# Patient Record
Sex: Female | Born: 1965 | Race: White | Hispanic: No | Marital: Married | Smoking: Never smoker
Health system: Southern US, Community
[De-identification: ages and names within clinical notes are randomized; demographics above are authoritative.]

## PROBLEM LIST (undated history)

## (undated) DIAGNOSIS — E785 Hyperlipidemia, unspecified: Secondary | ICD-10-CM

## (undated) DIAGNOSIS — M5030 Other cervical disc degeneration, unspecified cervical region: Secondary | ICD-10-CM

## (undated) DIAGNOSIS — N200 Calculus of kidney: Secondary | ICD-10-CM

## (undated) DIAGNOSIS — I10 Essential (primary) hypertension: Secondary | ICD-10-CM

## (undated) DIAGNOSIS — Z9889 Other specified postprocedural states: Secondary | ICD-10-CM

## (undated) DIAGNOSIS — E0590 Thyrotoxicosis, unspecified without thyrotoxic crisis or storm: Secondary | ICD-10-CM

## (undated) DIAGNOSIS — E119 Type 2 diabetes mellitus without complications: Secondary | ICD-10-CM

## (undated) DIAGNOSIS — D649 Anemia, unspecified: Secondary | ICD-10-CM

## (undated) DIAGNOSIS — T7840XA Allergy, unspecified, initial encounter: Secondary | ICD-10-CM

## (undated) DIAGNOSIS — K219 Gastro-esophageal reflux disease without esophagitis: Secondary | ICD-10-CM

## (undated) DIAGNOSIS — M859 Disorder of bone density and structure, unspecified: Secondary | ICD-10-CM

## (undated) DIAGNOSIS — Z87442 Personal history of urinary calculi: Secondary | ICD-10-CM

## (undated) HISTORY — DX: Thyrotoxicosis, unspecified without thyrotoxic crisis or storm: E05.90

## (undated) HISTORY — DX: Type 2 diabetes mellitus without complications: E11.9

## (undated) HISTORY — PX: URETHRAL DILATION: SUR417

## (undated) HISTORY — PX: COLONOSCOPY: SHX174

## (undated) HISTORY — DX: Essential (primary) hypertension: I10

## (undated) HISTORY — DX: Anemia, unspecified: D64.9

## (undated) HISTORY — PX: LITHOTRIPSY: SUR834

## (undated) HISTORY — PX: CHOLECYSTECTOMY: SHX55

## (undated) HISTORY — DX: Hyperlipidemia, unspecified: E78.5

## (undated) HISTORY — PX: KIDNEY STONE SURGERY: SHX686

## (undated) HISTORY — PX: DILATION AND CURETTAGE OF UTERUS: SHX78

## (undated) HISTORY — DX: Allergy, unspecified, initial encounter: T78.40XA

---

## 1996-03-14 HISTORY — PX: BACK SURGERY: SHX140

## 1998-01-05 ENCOUNTER — Ambulatory Visit (HOSPITAL_COMMUNITY): Admission: RE | Admit: 1998-01-05 | Discharge: 1998-01-05 | Payer: Self-pay | Admitting: Urology

## 1998-01-05 ENCOUNTER — Encounter: Payer: Self-pay | Admitting: Urology

## 1998-04-22 ENCOUNTER — Observation Stay (HOSPITAL_COMMUNITY): Admission: RE | Admit: 1998-04-22 | Discharge: 1998-04-23 | Payer: Self-pay | Admitting: Specialist

## 1998-04-22 ENCOUNTER — Encounter: Payer: Self-pay | Admitting: Specialist

## 1999-02-06 ENCOUNTER — Encounter: Payer: Self-pay | Admitting: Emergency Medicine

## 1999-02-06 ENCOUNTER — Emergency Department (HOSPITAL_COMMUNITY): Admission: EM | Admit: 1999-02-06 | Discharge: 1999-02-06 | Payer: Self-pay | Admitting: Emergency Medicine

## 1999-02-19 ENCOUNTER — Encounter: Admission: RE | Admit: 1999-02-19 | Discharge: 1999-02-19 | Payer: Self-pay | Admitting: Urology

## 1999-02-19 ENCOUNTER — Encounter: Payer: Self-pay | Admitting: Urology

## 1999-02-22 ENCOUNTER — Ambulatory Visit (HOSPITAL_COMMUNITY): Admission: RE | Admit: 1999-02-22 | Discharge: 1999-02-22 | Payer: Self-pay | Admitting: Urology

## 1999-02-22 ENCOUNTER — Encounter: Payer: Self-pay | Admitting: Urology

## 1999-03-01 ENCOUNTER — Ambulatory Visit (HOSPITAL_COMMUNITY): Admission: RE | Admit: 1999-03-01 | Discharge: 1999-03-01 | Payer: Self-pay | Admitting: Urology

## 1999-03-01 ENCOUNTER — Encounter: Payer: Self-pay | Admitting: Urology

## 1999-03-12 ENCOUNTER — Encounter: Admission: RE | Admit: 1999-03-12 | Discharge: 1999-03-12 | Payer: Self-pay | Admitting: Urology

## 1999-03-12 ENCOUNTER — Encounter: Payer: Self-pay | Admitting: Urology

## 1999-06-18 ENCOUNTER — Encounter: Payer: Self-pay | Admitting: Urology

## 1999-06-18 ENCOUNTER — Encounter: Admission: RE | Admit: 1999-06-18 | Discharge: 1999-06-18 | Payer: Self-pay | Admitting: Urology

## 2000-07-26 ENCOUNTER — Encounter: Payer: Self-pay | Admitting: Urology

## 2000-07-26 ENCOUNTER — Ambulatory Visit (HOSPITAL_COMMUNITY): Admission: RE | Admit: 2000-07-26 | Discharge: 2000-07-26 | Payer: Self-pay | Admitting: Urology

## 2002-08-13 ENCOUNTER — Encounter: Payer: Self-pay | Admitting: Emergency Medicine

## 2002-08-13 ENCOUNTER — Emergency Department (HOSPITAL_COMMUNITY): Admission: EM | Admit: 2002-08-13 | Discharge: 2002-08-13 | Payer: Self-pay | Admitting: Emergency Medicine

## 2002-09-05 ENCOUNTER — Ambulatory Visit (HOSPITAL_BASED_OUTPATIENT_CLINIC_OR_DEPARTMENT_OTHER): Admission: RE | Admit: 2002-09-05 | Discharge: 2002-09-05 | Payer: Self-pay | Admitting: Urology

## 2005-01-11 ENCOUNTER — Ambulatory Visit: Payer: Self-pay | Admitting: Gastroenterology

## 2005-01-13 ENCOUNTER — Ambulatory Visit: Payer: Self-pay | Admitting: Gastroenterology

## 2005-02-01 ENCOUNTER — Ambulatory Visit: Payer: Self-pay | Admitting: Gastroenterology

## 2005-03-04 ENCOUNTER — Ambulatory Visit (HOSPITAL_COMMUNITY): Admission: RE | Admit: 2005-03-04 | Discharge: 2005-03-04 | Payer: Self-pay | Admitting: General Surgery

## 2005-03-04 ENCOUNTER — Encounter (INDEPENDENT_AMBULATORY_CARE_PROVIDER_SITE_OTHER): Payer: Self-pay | Admitting: Specialist

## 2005-07-18 ENCOUNTER — Ambulatory Visit: Payer: Self-pay | Admitting: Internal Medicine

## 2005-07-27 ENCOUNTER — Ambulatory Visit: Payer: Self-pay | Admitting: Internal Medicine

## 2005-07-27 ENCOUNTER — Encounter (INDEPENDENT_AMBULATORY_CARE_PROVIDER_SITE_OTHER): Payer: Self-pay | Admitting: *Deleted

## 2006-07-21 ENCOUNTER — Other Ambulatory Visit: Admission: RE | Admit: 2006-07-21 | Discharge: 2006-07-21 | Payer: Self-pay | Admitting: Family Medicine

## 2006-07-24 ENCOUNTER — Encounter: Admission: RE | Admit: 2006-07-24 | Discharge: 2006-07-24 | Payer: Self-pay | Admitting: Family Medicine

## 2007-07-23 ENCOUNTER — Other Ambulatory Visit: Admission: RE | Admit: 2007-07-23 | Discharge: 2007-07-23 | Payer: Self-pay | Admitting: Family Medicine

## 2007-07-27 ENCOUNTER — Encounter: Admission: RE | Admit: 2007-07-27 | Discharge: 2007-07-27 | Payer: Self-pay | Admitting: Family Medicine

## 2007-08-20 ENCOUNTER — Ambulatory Visit (HOSPITAL_COMMUNITY): Admission: RE | Admit: 2007-08-20 | Discharge: 2007-08-20 | Payer: Self-pay | Admitting: Obstetrics and Gynecology

## 2007-08-20 ENCOUNTER — Encounter (INDEPENDENT_AMBULATORY_CARE_PROVIDER_SITE_OTHER): Payer: Self-pay | Admitting: Obstetrics and Gynecology

## 2007-09-10 ENCOUNTER — Inpatient Hospital Stay (HOSPITAL_COMMUNITY): Admission: RE | Admit: 2007-09-10 | Discharge: 2007-09-13 | Payer: Self-pay | Admitting: Urology

## 2009-02-17 ENCOUNTER — Encounter: Payer: Self-pay | Admitting: Nurse Practitioner

## 2009-03-17 ENCOUNTER — Telehealth: Payer: Self-pay | Admitting: Internal Medicine

## 2009-03-18 ENCOUNTER — Ambulatory Visit: Payer: Self-pay | Admitting: Gastroenterology

## 2009-03-18 DIAGNOSIS — E119 Type 2 diabetes mellitus without complications: Secondary | ICD-10-CM

## 2009-03-18 DIAGNOSIS — I10 Essential (primary) hypertension: Secondary | ICD-10-CM | POA: Insufficient documentation

## 2009-03-18 DIAGNOSIS — R935 Abnormal findings on diagnostic imaging of other abdominal regions, including retroperitoneum: Secondary | ICD-10-CM

## 2009-03-18 DIAGNOSIS — K648 Other hemorrhoids: Secondary | ICD-10-CM | POA: Insufficient documentation

## 2009-03-18 DIAGNOSIS — L719 Rosacea, unspecified: Secondary | ICD-10-CM | POA: Insufficient documentation

## 2009-03-18 DIAGNOSIS — K644 Residual hemorrhoidal skin tags: Secondary | ICD-10-CM | POA: Insufficient documentation

## 2009-03-20 ENCOUNTER — Ambulatory Visit (HOSPITAL_COMMUNITY): Admission: RE | Admit: 2009-03-20 | Discharge: 2009-03-20 | Payer: Self-pay | Admitting: Gastroenterology

## 2009-03-23 ENCOUNTER — Telehealth: Payer: Self-pay | Admitting: Nurse Practitioner

## 2009-03-27 ENCOUNTER — Telehealth: Payer: Self-pay | Admitting: Nurse Practitioner

## 2010-04-13 NOTE — Procedures (Signed)
Summary: [facility] COLON   Colonoscopy  Procedure date:  07/27/2005  Findings:      Location:  [facility].    Procedures Next Due Date:    Colonoscopy: 08/2015 Patient Name: Kelli Brandt, Kelli Brandt. MRN:  Procedure Procedures: Colonoscopy CPT: [phone].    with polypectomy. CPT: A3573898.  Personnel: Endoscopist: Wilhemina Bonito. Marina Goodell, MD.  Referred By: Bernadette Hoit, MD.  Exam Location: Exam performed in Outpatient Clinic. Outpatient  Patient Consent: Procedure, Alternatives, Risks and Benefits discussed, consent obtained, from patient. Consent was obtained by the RN.  Indications  Evaluation of: Anemia with low iron saturation. Microcytic.  Symptoms: Hematochezia.  History  Current Medications: Patient is not currently taking Coumadin.  Pre-Exam Physical: Performed Jul 27, 2005. Cardio-pulmonary exam, Rectal exam, Abdominal exam, Mental status exam WNL.  Comments: Pt. history reviewed/updated, physical exam performed prior to initiation of sedation?YES Exam Exam: Extent of exam reached: Terminal Ileum, extent intended: Terminal Ileum.  The cecum was identified by appendiceal orifice and IC valve. Patient position: on left side. Colon retroflexion performed. Images taken. ASA Classification: II. Tolerance: excellent.  Monitoring: Pulse and BP monitoring, Oximetry used. Supplemental O2 given.  Colon Prep Used OSMO PREP for colon prep. Prep results: excellent.  Sedation Meds: Patient assessed and found to be appropriate for moderate (conscious) sedation. Fentanyl 75 mcg. given IV. Versed 9 mg. given IV.  Findings NORMAL EXAM: Ileum to Rectum.  POLYP: Cecum, diminutive, Procedure:  snare without cautery, removed, not retrieved, ICD9: Colon Polyps: 211.3. Comments: No meaningful tissue avaiable for submission.   Assessment  Diagnoses: 211.3: Colon Polyps.  455.0: Hemorrhoids, Internal.   Events  Unplanned Interventions: No intervention was required.  Unplanned  Events: There were no complications. Plans Disposition: After procedure patient sent to recovery. After recovery patient sent home.  Scheduling/Referral: Colonoscopy, to Wilhemina Bonito. Marina Goodell, MD, in 10 years,    cc:  Bernadette Hoit, MD  This report was created from the original endoscopy report, which was reviewed and signed by the above listed endoscopist.

## 2010-04-13 NOTE — Assessment & Plan Note (Signed)
Summary: severe hemorrhoid pain   History of Present Illness Visit Type: new patient Primary GI MD: Yancey Flemings MD Primary Provider: Joycelyn Rua, MD Chief Complaint: hemorrhoid pain x 7 days, pt denies any rectal bleeding History of Present Illness:   Patient evaluated for iron deficiency anemia in 2005 by  Dr. Marina Goodell. She is now worked in for hemorrhoids and rectal pain. Has hemorrhoidal flares from time to time but usually responds to sitz baths and Prep H. She is having associated rectal pain. No bleeding.  Initially denied constipation but then recalls she occasionally has a hard stool. Patient has no other gastrointestinal complaints.    GI Review of Systems      Denies abdominal pain, acid reflux, belching, bloating, chest pain, dysphagia with liquids, dysphagia with solids, heartburn, loss of appetite, nausea, vomiting, vomiting blood, weight loss, and  weight gain.      Reports hemorrhoids and  rectal pain.     Denies anal fissure, black tarry stools, change in bowel habit, constipation, diarrhea, diverticulosis, fecal incontinence, heme positive stool, irritable bowel syndrome, jaundice, light color stool, liver problems, and  rectal bleeding.   Current Medications (verified): 1)  Lisinopril-Hydrochlorothiazide 20-25 Mg Tabs (Lisinopril-Hydrochlorothiazide) .... Take 1 Tablet By Mouth Once A Day 2)  Doxycycline Hyclate 100 Mg Tabs (Doxycycline Hyclate) .... Take 1 Tablet By Mouth Once A Day 3)  Metformin Hcl 500 Mg Tabs (Metformin Hcl) .... Take 1 Tablet By Mouth Two Times A Day 4)  Omeprazole 20 Mg Cpdr (Omeprazole) .... Take 2 Tablets By Mouth Once Daily 5)  Urocit-K 5 5 Meq (540 Mg) Cr-Tabs (Potassium Citrate) .... Take 1 Tablet By Mouth Two Times A Day 6)  Taclonex 0.005-0.064 % Oint (Calcipotriene-Betameth Diprop) .... Apply As Needed For Psoriasis 7)  Clobex Spray 0.05 % Liqd (Clobetasol Propionate) .... Apply As Needed For Psoriasis  Allergies (verified): No Known Drug  Allergies  Past History:  Past Medical History: colon polyp (diminutive), not retrieved-2005 Diabetes Hypertension Kidney Stones Urinary Tract Infection Rosacea Psoriasis  Past Surgical History: Back Surgery Cholecystectomy (gallstones) Lithotripsy X 2 D&C/Novasure 6/09 Surgical removal of kidney stones  Family History: Family History of Breast Cancer: MGM? No FH of Colon Cancer: Family History of Colon Polyps: Father Family History of Diabetes: Father, Mother Family History of Heart Disease: Father  Social History: Married, 1 boy, 1 girl Patient has never smoked.  Alcohol Use - no Daily Caffeine Use 1/day Illicit Drug Use - no  Review of Systems       The patient complains of allergy/sinus.  The patient denies anemia, anxiety-new, arthritis/joint pain, back pain, blood in urine, breast changes/lumps, confusion, cough, coughing up blood, depression-new, fainting, fatigue, fever, headaches-new, hearing problems, heart murmur, heart rhythm changes, itching, menstrual pain, muscle pains/cramps, night sweats, nosebleeds, pregnancy symptoms, shortness of breath, skin rash, sleeping problems, sore throat, swelling of feet/legs, swollen lymph glands, thirst - excessive, urination - excessive, urination changes/pain, urine leakage, vision changes, and voice change.    Vital Signs:  Patient profile:   45 year old female Height:      64.5 inches Weight:      190 pounds BMI:     32.23 Pulse rate:   68 / minute Pulse rhythm:   regular BP sitting:   96 / 62  (left arm) Cuff size:   regular  Vitals Entered By: Francee Piccolo CMA Duncan Dull) (March 18, 2009 10:48 AM)  Physical Exam  General:  Well developed, well nourished, no acute distress.  Head:  Normocephalic and atraumatic. Eyes:  Conjunctiva pink, no icterus.  Neck:  no obvious masses  Lungs:  Clear throughout to auscultation. Heart:  Regular rate and rhythm; no murmurs, rubs,  or bruits. Abdomen:  Abdomen soft,  nontender, nondistended. No obvious masses. Liver edge 5 fingerbredths below right subcostal margin..Normal bowel sounds.  Rectal:  Very large inflamed hemorrhoid, non-thrombosed, reducible. Digital exam not done secdondary to discomfort. Msk:  Symmetrical with no gross deformities. Normal posture. Extremities:  No palmar erythema, no edema.  Neurologic:  Alert and  oriented x4;  grossly normal neurologically. Skin:  Psoriactic lesions on abdomen, elbows. Cervical Nodes:  No significant cervical adenopathy. Psych:  Alert and cooperative. Normal mood and affect.   Impression & Recommendations:  Problem # 1:  HEMORRHOIDS-INTERNAL (ICD-455.0) Assessment Deteriorated Grade 3 internal hemorrhoid. Use Miralax for next few days to keep stools soft. Start Anusol Suppositories, fiber supplements, continue sitz baths. Recheck in one week, if no improvement will send for surgical evaluation.  Problem # 2:  NONSPEC ABN FINDNG RAD & OTH EXAM ABDOMINAL AREA (ICD-793.6) Assessment: New Hepatomegaly on exam. Obtain recent labs from PCP, patient recalls some LFT abnormalities. She is nervous about possibility of liver disease. We discussed likely causes such as medications, fatty liver disease. Obtain U/S of abdomen. Will call patient with results and any further recommendations based on those results.  Problem # 3:  DIABETES MELLITUS-TYPE II (ICD-250.00) Assessment: Comment Only On Metformin.   Problem # 4:  HYPERTENSION (ICD-401.9) Assessment: Comment Only  Problem # 5:  ROSACEA (ICD-695.3) On chronic Doxycycline.  Patient Instructions: 1)  We schedueld the [facility] at [facility] for 03-20-09 at 8:00AM.Instructions provided. 2)  We sent persciption for Xylocaine Jelly and Anusol Hc Suppositories to [facility]. 3)  Continue Sitz Baths. 4)  Use Miralax, 17 grams in 8 oz water for 3-4 days to avoid constipation. 5)  High Fiber, Low Fat  Healthy Eating Plan brochure given.  6)   Use Benefiber as a fiber supplement daily.  You can get that at your pharmacy. 7)  Call us next week, if you still feel the hemorrhoids we will be glad to do a quick recheck and have you come in.  If things are better you don't have to come in. 8)  Copy sent to : Joycelyn Rua, MD 9)  The medication list was reviewed and reconciled.  All changed / newly prescribed medications were explained.  A complete medication list was provided to the patient / caregiver.  Prescriptions: ANUSOL-HC 25 MG SUPP (HYDROCORTISONE ACETATE) Use 1 supp twice daily x 10 days  #10 x 1   Entered by:   Lowry Ram NCMA   Authorized by:   Willette Cluster NP   Signed by:   Lowry Ram NCMA on 03/18/2009   Method used:   Electronically to        [facility] [phone]* (retail)       [address]       [place], [place]  [address]       Ph: [phone]       Fax: [phone]   RxID:   [id number] XYLOCAINE JELLY 2 % GEL (LIDOCAINE HCL) Apply  at rectal area for Hemorrhoids 3-4 times daily as needed for discomfort  #200 cc x 0   Entered by:   Lowry Ram NCMA   Authorized by:   Willette Cluster NP   Signed by:   Lowry Ram NCMA on 03/18/2009  Method used:   Electronically to        [facility] [phone]* (retail)       [address] [place], [place]  [address]       Ph: [phone]       Fax: [phone]   RxID:   [id number]

## 2010-04-13 NOTE — Procedures (Signed)
Summary: [facility] EGD   EGD  Procedure date:  07/27/2005  Findings:      Location: [facility]   Patient Name: Kelli Brandt, Kelli Brandt. MRN:  Procedure Procedures: Panendoscopy (EGD) CPT: 43235.    with biopsy(s)/brushing(s). CPT: D1846139.  Personnel: Endoscopist: Wilhemina Bonito. Marina Goodell, MD.  Referred By: Bernadette Hoit, MD.  Exam Location: Exam performed in Outpatient Clinic. Outpatient  Patient Consent: Procedure, Alternatives, Risks and Benefits discussed, consent obtained, from patient. Consent was obtained by the RN.  Indications  Evaluation of: Anemia,  with low iron saturation. Microcytic.  Symptoms: Reflux symptoms  History  Current Medications: Patient is not currently taking Coumadin.  Pre-Exam Physical: Performed Jul 27, 2005  Cardio-pulmonary exam, Abdominal exam, Mental status exam WNL.  Comments: Pt. history reviewed/updated, physical exam performed prior to initiation of sedation?yes Exam Exam Info: Maximum depth of insertion Duodenum, intended Duodenum. Patient position: on left side. Vocal cords visualized. Gastric retroflexion performed. Images taken. ASA Classification: II. Tolerance: excellent.  Sedation Meds: Patient assessed and found to be appropriate for moderate (conscious) sedation. Residual sedation present from prior procedure today. Fentanyl 25 mcg. given IV. Versed 1 mg. given IV. Cetacaine Spray 2 sprays  Monitoring: BP and pulse monitoring done. Oximetry used. 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  Constriction: partial. Etiology: benign due to reflux. 35 cm from mouth. Lumen diameter is 16 mm. ICD9: Esophageal Stricture: 530.3. Comment: No Barrett's or inflammation.  HIATAL HERNIA:  - Normal: Cardia to Duodenal 2nd Portion. Biopsy/Normal taken. Comments: Bx d2/d3 to r/o sprue.   Assessment  Diagnoses: 530.3: Esophageal Stricture.  553.3: Hernia, Hiatal.  530.81: GERD.    Comments: SUSPECT IRON DEFICIENCY ANEMIA SECONDARY TO CHRONIC BLOOD LOSS THROUGH MENSTUATION Events  Unplanned Intervention: No unplanned interventions were required.  Unplanned Events: There were no complications. Plans Comments: CONTINUE NEXIUM IRON TWICE DAILY Disposition: After procedure patient sent to recovery. After recovery patient sent home.  Comments: RETURN TO THE CARE OF DR. Riley Nearing TO MONITOR YOUR BLOOD COUNTS ON IRON  cc:  Bernadette Hoit, MD  This report was created from the original endoscopy report, which was reviewed and signed by the above listed endoscopist.

## 2010-04-13 NOTE — Progress Notes (Signed)
Summary: fyi   Phone Note Call from Patient Call back at Home Phone [phone]   Caller: Patient Call For: Gunnar Fusi Reason for Call: Talk to Nurse Summary of Call: Patient states that her hemorroids are still exposed and still has some burning and itching but theres no pain. Initial call taken by: Tawni Levy,  March 23, 2009 9:06 AM  Follow-up for Phone Call        Burning and itching can be expected for a while. Is her hemorrhoid shrinking in size? Follow-up by: Willette Cluster NP,  March 23, 2009 10:00 AM     Appended Document: fyi  Pt said they have gone down slightly, she is still using the Suppositories twice daily and using the Xylocaine jelly which has helped the pain. She has only seen a very slight amount of blood after a BM when she wipes with toilet tissue.  She is also using baby wipes.  I told her to put a tuck pad after she uses the  PM suppository when she is ready to get in bed against the hemorrhoid.  I told her I will call her Thurs again.  Appended Document: fyi  Sounds good. As long as they are shrinking we can continue plan, otherwise she needs to come in for recheck.Thanks

## 2010-04-13 NOTE — Progress Notes (Signed)
Summary: Triage  Phone Note Call from Patient Call back at Home Phone [phone]   Caller: Patient Call For: Dr. Russella Dar Reason for Call: Talk to Nurse Summary of Call: Pt is calling back about her hemorroids. Wants to know if she should increase her suppositories Initial call taken by: Karna Christmas,  March 27, 2009 9:07 AM  Follow-up for Phone Call        Patient  is out of suppositories I have advised her I will call in a refill for q hs suppositories and call her in some hydrocortisone cream for daytime use. Patient reports the pain has subsided and there is some shrinking of the hemorrhoid.  She still reports itching and burning at times.  I have advised ehr to continue sitz baths, TUCS pads and hydrocortisone cream as needed. Follow-up by: Darcey Nora RN, CGRN,  March 27, 2009 10:22 AM    New/Updated Medications: ANUSOL-HC 25 MG SUPP (HYDROCORTISONE ACETATE) Use 1 supp q hs  x 12 days HYDROCORTISONE ACE-PRAMOXINE 2.5-1 % CREA (HYDROCORTISONE ACE-PRAMOXINE) apply to hemorrhoids three times a day and PRN Prescriptions: HYDROCORTISONE ACE-PRAMOXINE 2.5-1 % CREA (HYDROCORTISONE ACE-PRAMOXINE) apply to hemorrhoids three times a day and PRN  #30 gm x 0   Entered by:   Darcey Nora RN, CGRN   Authorized by:   Willette Cluster NP   Signed by:   Darcey Nora RN, CGRN on 03/27/2009   Method used:   Electronically to        [facility] [phone]* (retail)       [address]       [place], [place]  [address]       Ph: [phone]       Fax: [phone]   RxID:   [id number] ANUSOL-HC 25 MG SUPP (HYDROCORTISONE ACETATE) Use 1 supp q hs  x 12 days  #12 x 0   Entered by:   Darcey Nora RN, CGRN   Authorized by:   Willette Cluster NP   Signed by:   Darcey Nora RN, CGRN on 03/27/2009   Method used:   Electronically to        [facility] [phone]* (retail)       [address]       [place], [place]  [address]       Ph: [phone]       Fax: [phone]   RxID:    [id number]

## 2010-04-13 NOTE — Progress Notes (Signed)
Summary: Triage  Phone Note Call from Patient Call back at Home Phone [phone]   Caller: Patient Call For: Dr. Marina Goodell Reason for Call: Talk to Nurse Summary of Call: Pt wants to know if she can get worked in with Dr. Marina Goodell. She is in alot of pain with her hemorrhoids Initial call taken by: Karna Christmas,  March 17, 2009 2:35 PM  Follow-up for Phone Call        Given appt. for tomorrow am with N.P. as Dr.Perry is not scheduled in the office  Follow-up by: Teryl Lucy RN,  March 17, 2009 3:41 PM

## 2010-07-27 NOTE — Op Note (Signed)
NAME:  Kelli Brandt, Kelli Brandt NO.:  [id number]   MEDICAL RECORD NO.:  [id number]          PATIENT TYPE:  AMB   LOCATION:  SDC                           FACILITY:  [facility]   PHYSICIAN:  Malva Limes, M.D.    DATE OF BIRTH:  [birth year]   DATE OF PROCEDURE:  08/20/2007  DATE OF DISCHARGE:                               OPERATIVE REPORT   PREOPERATIVE DIAGNOSES:  1. Menorrhagia.  2. Endometrial polyps.   POSTOPERATIVE DIAGNOSES:  1. Menorrhagia.  2. Endometrial polyps.   PROCEDURE:  1. Hysteroscopy.  2. Dilation curettage.  3. NovaSure endometrial ablation.   SURGEON:  Malva Limes, MD   ANESTHESIA:  General.   ANTIBIOTICS:  Ancef 1 g.   DRAINS:  Red rubber catheter to bladder.   COMPLICATIONS:  None.   SPECIMENS:  Endometrial curettings sent to pathology.   FINDINGS:  The patient had several endometrial polyps scattered  throughout her endometrial cavity.  The largest was on the anterior  right surface.   PROCEDURE:  The patient was taken to the operating room where general  anesthetic was administered without complications.  She was then placed  in dorsal lithotomy position.  She was prepped with Betadine and draped  in usual fashion for this procedure.  Her bladder was drained with a red  rubber catheter.  Sterile speculum was placed in the vagina.  20 mL of  1% lidocaine was used for paracervical block.  Single-tooth tenaculum  was applied to the anterior cervical lip.  The cervix was serially  dilated to a 29-French.  The uterus was sounded at 10 cm.  The cervical  length was 3.5 cm.  The hysteroscope was advanced into the uterine  cavity where both ostia were visualized and the polyps noted.  There was  no evidence of any submucous fibroids or malignancy.  At this point,  sharp curettage was performed.  Following this, the NovaSure device was  placed into the uterine cavity and opened, the width was 4.4  cm.  The seal test passed and device was turned  on for 60 seconds.  The  patient tolerated the procedure well.  The device was removed.  The  patient was taken to recovery room in stable condition.  She will be  discharged to home with Percocet to take p.r.n.  She will follow up in  the office in 2 weeks.           ______________________________  Malva Limes, M.D.     MA/MEDQ  D:  08/20/2007  T:  08/20/2007  Job:  [id number]

## 2010-07-27 NOTE — Discharge Summary (Signed)
NAME:  Kelli Brandt, HAMMONTREE                ACCOUNT NO.:  [id number]   MEDICAL RECORD NO.:  [id number]          PATIENT TYPE:  INP   LOCATION:  1414                         [facility]ACILITY[facility]  [facility]   PHYSICIAN:  Sigmund I. Patsi Sears, M.D.DATE OF BIRTH:  1965-08-14   DATE OF ADMISSION:  09/10/2007  DATE OF DISCHARGE:  09/13/2007                               DISCHARGE SUMMARY   FINAL DIAGNOSES:  For this patient:  1. Left large renal calculi.  2. Recurrent ureteropelvic junction obstruction.   SURGERIES:  1. Most  important surgery took place on September 10, 2007.  The operation      was left pyelolithotomy.  2. Left pyeloplasty.  3. Subcutaneous Marcaine pump placement.   The patient's history is as follows:  Kelli Brandt is a 45 year old female  with a history of previous urologic problems including nephrolithiasis  as well as UPJ obstruction.  She is status post balloon dilation of the  left UPJ with followup Lasix renal scan showing relief of her  obstruction.  The patient recently returned with flank pain and CT scan  showed 2 large left renal calculi measuring greater than 4 cm total  aggregate.  The patient also appeared to have recurrent UPJ obstruction.  She is admitted via the operating room for open pyelolithotomy and  pyeloplasty.   Her past history is significant for:  1. GERD.  2. Hypertension.  3. Psoriasis.  4. Nephrolithiasis.  5. UTI, chronic.  6. UPJ obstruction.   PAST SURGERIES:  1. Back surgery.  2. Cholecystectomy.  3. Lithotripsy.  4. Balloon dilation of UPJ.   CURRENT MEDICATIONS:  1. Clobex.  2. Doxicycline.  3. Hydrochlorothiazide.  4. Nexium.  5. Trimethoprim.  6. Quinapril.   ALLERGIES:  None known.   SOCIAL:  The patient uses no tobacco, no alcohol.  Lives in at home with  her husband.   FAMILY HISTORY:  Is noncontributory.   PHYSICAL EXAMINATION:  Is as noted in dictated H and P of September 10, 2007.   HOSPITAL COURSE:  On the day of admission, the  patient underwent left  pyelolithotomy and left pyeloplasty.  Double-J stent was placed at the  time, and subcutaneous Marcaine pump was placed.  The patientnow has  some flank pain come up but the pain pump has helped decrease the amount  of narcotic that she takes.  This is now ready to be removed.  She has  had flatus and small bowel movement.  She is ready for discharge, is  discharged in stable condition.  She will return in 1 week for staple  removal and consideration double-J removal.  Note:  The kidney stones  removed were sent from the office for evaluation.      Sigmund I. Patsi Sears, M.D.  Electronically Signed     SIT/MEDQ  D:  09/13/2007  T:  09/13/2007  Job:  [id number]

## 2010-07-27 NOTE — Op Note (Signed)
NAME:  Kelli Brandt, Kelli Brandt                ACCOUNT NO.:  [id number]   MEDICAL RECORD NO.:  [id number]          PATIENT TYPE:  INP   LOCATION:  1414                         FACILITY:  [facility]   PHYSICIAN:  Sigmund I. Patsi Sears, M.D.DATE OF BIRTH:  12/01/1965   DATE OF PROCEDURE:  09/10/2007  DATE OF DISCHARGE:                               OPERATIVE REPORT   PREOPERATIVE DIAGNOSES:  1. Left renal pelvis calculi.  2. Recurrent ureteropelvic junction obstruction.   PROCEDURES PERFORMED:  1. Left pyelolithotomy.  2. Left pyeloplasty.  3. Double-J stent placement.  4. Subcutaneous Marcaine pump placement.   SURGEON:  Sigmund I. Patsi Sears, M.D.   ASSISTANT:  Melina Schools, MD   ANESTHESIA:  General.   INDICATIONS FOR PROCEDURE:  Please see full dictated history and  physical.  Briefly, this is a 45 year old female with two greater than 2-  cm left renal calculi as well as a recurrent left UPJ obstruction.   DESCRIPTION OF PROCEDURE:  The patient was brought to the operating  room.  She was identified by arm band.  Informed consent was verified  and preoperative time-out was performed.  The correct side and patient  was identified and the specimen time-out was performed.  Perioperative  antibiotics were administered and sequential compression devices were  employed.  After the successful induction of general anesthesia, the  patient was moved to the left flank position with the table in flexion.  All appropriate pressure points were padded to avoid neurapraxia or  compartment syndrome.  The operative site was prepped and draped in the  usual fashion.  Surgeons were gowned and gloved.   The twelfth rib was palpated and an incision was made off the bed of the  twelfth rib in the usual flank technique.  Bovie cautery was used to  carry this down to the level of the external oblique fascia.  We then  dissected all the way down to the tip of the twelfth rib with the  cautery.  We split the  muscle over the bed of the twelfth rib.  We used  the periosteal elevator to clean off the rib bed.  The rib was then  amputated.  We then extended our incision through the external oblique  for the length of the incision.  We then identified the internal oblique  and divided that with the cautery.  We then split the transversus  abdominis.  This allowed [place] to sweep the peritoneum layer away and  develop the retroperitoneal space.  This was done bluntly.  We  identified Gerota fascia.  We mobilized the colon off of  retroperitoneally until we could identify the psoas muscle.  We then  began to explore the retroperitoneum to find the left ureter.  The  dissection in this area was very difficult because the patient had an  intense inflammatory reaction.  We did eventually identify the left  ureter.  We used a right angle to get around it and placed a vessel loop  around it, tenting it upwards in the operative field.  We then followed  the dissection cephalad until we  were where we thought the renal pelvis  would be.  Against there was a dense, fatty, infiltrating reaction which  made the renal pelvis unclear.  We then mobilized of the lower pole and  the lateral attachments to the kidney, allowing [place] more mobility so that  we could rotate the kidney medially.  We then entered the fat near the  renal sinus and carefully dissected it off the kidney until we  identified the renal pelvis.  At this point we did that the ureter was  kinked amongst this inflammation.  We freed the ureter up as much as  possible while attempting to maintain its vascularity.  We then  completely exposed the renal pelvis.  We could palpate the stones in it.  A stay stitch was placed on the ureter.  We then made a transverse  pyelotomy into the renal pelvis.  We used stone forceps and removed the  two large stones.  We then irrigated the pelvis.  There were no stone  fragments within it.  We then began to plan the  reconstruction.  The  plan was to reduce the pelvis and widen the UPJ by extending our  incision down through the UPJ into the ureter and then closing a  transverse portion in a Heineke-Mikulicz maneuver and then also closing  the relaxing incision along the ureter.  We made an incision down  through the UPJ to the proximal ureter with the hook blade.  We then  passed a double-J stent, which will be removed at a later date.  This  was an 8  x 24 double J.  We then closed the transverse pyelotomy  vertically in a Heineke-Mikulicz fashion.  This was done in running  fashion with 4-0 chromic.  We then closed the area along the ureter,  which was now nicely opened, with interrupted 4-0 chromics.  We then  irrigated the wound.  We brought a Jackson-Pratt drain out the counter  incision inferiorly and it was secured in place.  We then proceeded to  closure.   The internal oblique and transversus abdominis were reapproximated with  a running #1 PDS.  We then placed the subcutaneous Marcaine pump.  We  then closed the external oblique with a #1 PDS in a running fashion.  The wound was irrigated.  The skin was closed with surgical staples.  At  this time the procedure was terminated.  The patient tolerated the  procedure well and there were no complications.  Sigmund Patsi Sears was  the attending primary responsible physician and was present and  participated in all aspects of the procedure.      Melina Schools, MD      Sigmund I. Patsi Sears, M.D.  Electronically Signed    JR/MEDQ  D:  09/11/2007  T:  09/11/2007  Job:  [id number]

## 2010-07-27 NOTE — H&P (Signed)
NAME:  Kelli Brandt, LEAVEY                ACCOUNT NO.:  [id number]   MEDICAL RECORD NO.:  [id number]          PATIENT TYPE:  INP   LOCATION:  1414                         FACILITY:  [facility]   PHYSICIAN:  Sigmund I. Patsi Sears, M.D.DATE OF BIRTH:  1965/10/25   DATE OF ADMISSION:  09/10/2007  DATE OF DISCHARGE:                              HISTORY & PHYSICAL   CHIEF COMPLAINT:  Here for surgery.   HISTORY OF PRESENT ILLNESS:  Kelli Brandt is a 45 year old female with a  history of previous urologic problems including nephrolithiasis as well  as left ureteropelvic junction obstruction, status post balloon  dilation.  She has recently been treated with both doxycycline and  trimethoprim for recurrent urinary tract infections.  She underwent CT  scan which showed two large left-sided renal pelvic calculi measuring  greater than 4 cm in total.  There is also some question as to whether  she has recurrent UPJ obstruction.  She is to undergo an open  pyelolithotomy with possible pyeloplasty to deal with both of these  problems in one setting.   PAST MEDICAL HISTORY:  1. Gastroesophageal reflux disease.  2. Hypertension.  3. Psoriasis.  4. Nephrolithiasis.  5. Urinary tract infection, chronic.  6. Ureteropelvic junction obstruction.   SURGICAL HISTORY:  1. Back surgery.  2. Cholecystectomy.  3. Lithotripsy.  4. Balloon dilation of ureter.   MEDICATIONS:  Clobex, doxycycline, hydrochlorothiazide, Nexium,  trimethoprim, quinapril.   ALLERGIES:  No known drug allergies.   SOCIAL HISTORY:  The patient has caffeine intake.  She denies ethanol or  tobacco use.   FAMILY HISTORY:  Denies any significant family history of  nephrolithiasis or genitourinary malignancy.   PHYSICAL EXAMINATION:  VITAL SIGNS:  Afebrile, stable vital signs.  GENERAL:  No acute distress.  HEENT:  Normocephalic and atraumatic.  NECK:  Trachea is midline.  CHEST:  Clear to auscultation.  CARDIAC:  Regular rate and  rhythm.  ABDOMEN:  Soft, obese, nontender, with no masses palpable.  SKIN:  Warm and well perfused.  NEUROLOGIC:  Alert and oriented x3.   CT SCAN:  The patient has a CT scan showing the above findings, mainly  two large intrarenal calculi as well as an extrarenal pelvis on the left  with possible dilation.   PLAN:  The patient will be taken to the operating room for  pyelolithotomy and pyeloplasty.  She has been informed of the risks and  benefits of the procedure and has chosen to proceed.  She will be  admitted to the hospital afterward for a period of observation.      Melina Schools, MD      Sigmund I. Patsi Sears, M.D.  Electronically Signed    JR/MEDQ  D:  09/11/2007  T:  09/11/2007  Job:  [id number]

## 2010-07-30 NOTE — Op Note (Signed)
Kelli Brandt, Kelli Brandt                ACCOUNT NO.:  [id number]   MEDICAL RECORD NO.:  [id number]          PATIENT TYPE:  AMB   LOCATION:  DAY                          FACILITY:  [facility]   PHYSICIAN:  Leonie Man, M.D.   DATE OF BIRTH:  1966-02-25   DATE OF PROCEDURE:  03/04/2005  DATE OF DISCHARGE:  03/04/2005                                 OPERATIVE REPORT   PREOPERATIVE DIAGNOSIS:  Chronic calculous cholecystitis.   POSTOPERATIVE DIAGNOSIS:  Chronic calculous cholecystitis.   PROCEDURE:  Laparoscopic cholecystectomy with intraoperative cholangiogram.   SURGEON:  Dr. Lurene Shadow.   ASSISTANT:  _______________   ANESTHESIA:  General.   SPECIMENS:  Specimen to lab, gallbladder with stones.   ESTIMATED BLOOD LOSS:  Estimated blood loss was minimal.   COMPLICATIONS:  There were no complications during the procedure. The  patient was transferred to the PACU in good condition.   INDICATIONS:  This patient is a 45 year old female with severe postprandial  abdominal bloating and pain extending from the right upper quadrant through  her back and shoulders, evaluated with gallbladder ultrasound which  demonstrates cholelithiasis. No associated gallbladder wall thickness or  pericholecystic fluid. The patient comes to the operating room now after the  risks and potential benefits of surgery have been fully discussed. All  questions answered and consent obtained. She was fully identified in the  waiting area and taken to the operating room.   Following the induction of satisfactory general anesthesia, the patient is  positioned supinely, and the abdomen prepped and draped to be included in a  sterile operative field. Open laparoscopy created at the umbilicus with  insertion of a Hassan cannula and insufflation of the peritoneal cavity to  14 mmHg pressure using carbon dioxide. Visual exploration of the abdomen was  carried out. The liver edges were sharp. Liver surfaces were smooth. The  gallbladder was noted to be chronically scarred with multiple adhesions of  both the duodenum and the omentum up to the gallbladder. The anterior  gastric wall and duodenal sweep appeared otherwise normal. None of the small  or large intestine viewed appeared to be abnormal. There multiple adhesions  down in the pelvis. Pelvic organs were not visualized.   Under direct vision, epigastric and lateral ports were placed. The  gallbladder was grasped and retracted cephalad and dissection carried down  into the region of the ampulla. Because of the patient's large size, we used  a 30-degree scope for better visualization at the region of the ampulla. The  cystic duct and cystic artery were dissected free, cystic duct being traced  up the cystic duct gallbladder junction and the cystic artery traced into  its entry into the gallbladder wall. The cystic duct was clipped proximally  and opened. Cholangiogram was carried out by placing a Cook catheter through  the abdominal wall and into the cystic duct and injecting one half strength  Hypaque under fluoroscopic guidance into the extrahepatic biliary system.  The resulting cholangiogram showed prompt flow of contrast into normal  caliber duct. No filling defects. Normal tapering of the distal common bile  duct with entry of contrast into the duodenum. Hepatic radicles also  appeared to be normal. The cholangiocatheter was then removed, and the  cystic duct was triply clipped and transected. The cystic artery was also  triply clipped and transected. The gallbladder was then dissected free from  the liver bed using electrocautery and maintaining hemostasis throughout the  entire course of the dissection. At the end of dissection, the gallbladder  was placed in an Endopouch. The liver bed again inspected. Additional  bleeding points treated with electrocautery. Right upper quadrant thoroughly  irrigated with multiple aliquots of normal saline. The  camera moved to the  epigastric port and the gallbladder retrieved through the umbilical port.  Sponge, instrument and sharp counts were verified, pneumoperitoneum deflated  and the wounds closed in layers as follows:  Umbilical wound in two layers  with 0 Vicryl and 4-0 Monocryl; epigastric and lateral flank wounds were  closed 4-0 Monocryl sutures. All wounds were then reinforced with Steri-  Strips and sterile dressings applied. The anesthetic is reversed, and the  patient removed from the operating room to the recovery room in stable  condition. She tolerated the procedure well.      Leonie Man, M.D.  Electronically Signed     PB/MEDQ  D:  03/04/2005  T:  03/08/2005  Job:  [id number]   cc:   Barbette Hair. Arlyce Dice, M.D. [facility]  [address]  [place]  [place] [address]

## 2010-12-09 LAB — CBC
HCT: 38.2
HCT: 31.7 — ABNORMAL LOW
Hemoglobin: 13
Hemoglobin: 10.7 — ABNORMAL LOW
MCHC: 33.9
MCV: 82.5
RBC: 4.63
RBC: 3.87
WBC: 8.8

## 2010-12-09 LAB — BASIC METABOLIC PANEL
BUN: 14
CO2: 30
CO2: 25
CO2: 29
Calcium: 9.1
Chloride: 100
Chloride: 104
GFR calc Af Amer: >60
GFR calc non Af Amer: >60
Glucose, Bld: 101 — ABNORMAL HIGH
Glucose, Bld: 141 — ABNORMAL HIGH
Potassium: 3.7
Potassium: 3.7
Potassium: 4.4
Sodium: 137
Sodium: 136

## 2010-12-09 LAB — DIFFERENTIAL
Basophils Relative: 1
Lymphocytes Relative: 11 — ABNORMAL LOW
Monocytes Absolute: 1.1 — ABNORMAL HIGH
Monocytes Relative: 8
Neutro Abs: 11.3 — ABNORMAL HIGH

## 2010-12-09 LAB — HEMOGLOBIN AND HEMATOCRIT, BLOOD: Hemoglobin: 13.5

## 2011-01-10 ENCOUNTER — Other Ambulatory Visit: Payer: Self-pay | Admitting: Obstetrics and Gynecology

## 2013-03-25 ENCOUNTER — Other Ambulatory Visit: Payer: Self-pay | Admitting: Obstetrics and Gynecology

## 2014-07-14 ENCOUNTER — Other Ambulatory Visit: Payer: Self-pay | Admitting: Obstetrics and Gynecology

## 2014-07-15 LAB — CYTOLOGY - PAP

## 2015-07-27 ENCOUNTER — Other Ambulatory Visit: Payer: Self-pay | Admitting: Obstetrics and Gynecology

## 2015-07-28 LAB — CYTOLOGY - PAP

## 2015-08-24 ENCOUNTER — Encounter: Payer: Self-pay | Admitting: Internal Medicine

## 2016-04-26 ENCOUNTER — Encounter: Payer: Self-pay | Admitting: Podiatry

## 2016-04-26 ENCOUNTER — Ambulatory Visit (HOSPITAL_BASED_OUTPATIENT_CLINIC_OR_DEPARTMENT_OTHER)
Admission: RE | Admit: 2016-04-26 | Discharge: 2016-04-26 | Disposition: A | Discharge status: Home / Self Care | Payer: 59 | Source: Ambulatory Visit | Attending: Podiatry | Admitting: Podiatry

## 2016-04-26 ENCOUNTER — Ambulatory Visit (INDEPENDENT_AMBULATORY_CARE_PROVIDER_SITE_OTHER): Payer: 59 | Admitting: Podiatry

## 2016-04-26 VITALS — BP 120/81 | HR 106 | Temp 97.7°F | Resp 18

## 2016-04-26 DIAGNOSIS — M21611 Bunion of right foot: Secondary | ICD-10-CM | POA: Diagnosis not present

## 2016-04-26 DIAGNOSIS — M21612 Bunion of left foot: Secondary | ICD-10-CM | POA: Insufficient documentation

## 2016-04-26 DIAGNOSIS — S90852A Superficial foreign body, left foot, initial encounter: Secondary | ICD-10-CM

## 2016-04-26 DIAGNOSIS — L97521 Non-pressure chronic ulcer of other part of left foot limited to breakdown of skin: Secondary | ICD-10-CM | POA: Diagnosis not present

## 2016-04-26 MED ORDER — CEPHALEXIN 500 MG PO CAPS: 500.0000 mg | ORAL_CAPSULE | Freq: Three times a day (TID) | ORAL | 0 refills | Status: DC

## 2016-04-26 NOTE — Progress Notes (Signed)
Subjective:    Patient ID: Kelli Brandt, female    DOB: [birth year], 51 y.o.   MRN: [id number]  HPI  51 year old female presents the office they for concerns of left foot pain. She states that she was in the kitchen on 3 weeks ago and she was wearing a sock. She went to go turn and she has sudden pain to her foot and she noticed bleeding to her foot. The next day was painful to put pressure to her foot that she went and saw a physician for this. She has been soaking in Epson salts. She has pain to the area she points to submetatarsal 2 on the area with the callus which is also scissors noted some blood in the calluses well. She denies any swelling redness or red streaks. She is having to put thick pads over the area as she feels like something is sticking her.   Review of Systems  All other systems reviewed and are negative.      Objective:   Physical Exam General: AAO x3, NAD  Dermatological: on the left foot submetatarsal 2 was a thick hyperkeratotic lesion with what appears to be blood underneath the callus. Upon debridement there was a central puncture wound present and I was able to palpate a piece of foreign object which appeared to be a piece of glass. The area was further debrided and I was able to remove a small piece of glass from her foot from this area. Hyperkeratotic tissue was further debrided and there did appear to be a superficial wound present with a granular wound base. There is no probing, undermining or tunneling. There is no swelling erythema, ascending cellulitis however there was mild edema. There  Are no other open lesions or pre-ulcerative lesions are 5 today.  Vascular: Dorsalis Pedis artery and Posterior Tibial artery pedal pulses are 2/4 bilateral with immedate capillary fill time. Pedal hair growth present. No varicosities and no lower extremity edema present bilateral. There is no pain with calf compression, swelling, warmth, erythema.   Neruologic: Grossly  intact via light touch bilateral. Vibratory intact via tuning fork bilateral. Protective threshold with Semmes Wienstein monofilament intact to all pedal sites bilateral.   Musculoskeletal: prior to debridement there was significant tenderness left foot submetatarsal 2. After debridement deformity was removed and she states that the area felt much better though still tender due to the wound. There is no other areas of tenderness present. 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 Muscular strength 5/5 in all groups tested bilateral.  Gait: Unassisted, Nonantalgic.      Assessment & Plan:  51 year old female left foot foreign body submetatarsal 2 -Treatment options discussed including all alternatives, risks, and complications -Etiology of symptoms were discussed -X-rays were obtained and reviewed with the patient. X-rays prior to debridement it reveals foreign-body submetatarsal 2. Repeat x-rays were performed afterwards which which did reveal the deformity was removed. 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 -hyperkeratotic lesions, wound sharply debrided today to the left foot some metatarsal 2 to reveal the underlying foreign body which was removed. In about ointment was applied followed by a bandage after the area was cleaned. She was placed into a surgical shoe at her request with offloading pads. When her to continue with antibiotic ointment dressing changes daily as well as Epson salt soaks. Given the foreign body did placed on Keflex today. She states her tetanus is up-to-date. -Monitor for any clinical signs or symptoms of infection and  directed to call the office immediately should any occur or go to the ER. -RTC in 3 weeks. If symptoms continue will need to get an ultrasound to rule out any residual foreign body.   Celesta Gentile, DPM

## 2016-05-17 ENCOUNTER — Ambulatory Visit: Payer: 59 | Admitting: Podiatry

## 2016-10-25 ENCOUNTER — Ambulatory Visit (INDEPENDENT_AMBULATORY_CARE_PROVIDER_SITE_OTHER): Payer: 59 | Admitting: Podiatry

## 2016-10-25 ENCOUNTER — Encounter: Payer: Self-pay | Admitting: Podiatry

## 2016-10-25 DIAGNOSIS — M779 Enthesopathy, unspecified: Secondary | ICD-10-CM

## 2016-10-25 DIAGNOSIS — M7742 Metatarsalgia, left foot: Secondary | ICD-10-CM | POA: Diagnosis not present

## 2016-10-25 DIAGNOSIS — D3610 Benign neoplasm of peripheral nerves and autonomic nervous system, unspecified: Secondary | ICD-10-CM

## 2016-10-25 MED ORDER — MELOXICAM 15 MG PO TABS: 15.0000 mg | ORAL_TABLET | Freq: Every day | ORAL | 2 refills | Status: AC

## 2016-10-26 NOTE — Progress Notes (Signed)
Subjective: Ms. Standre presents to the office today for concerns of left foot pain into the ball of the foot as well as some occasional numbness into the ball of the foot. She states this has been ongoingfor several years. She states that normally happens more in the wintertime when she wears closed in shoes and she changes shoes a does resolve however now does become more consistent. She denies any recent injury or trauma. Other than changing shoes she's had no other treatment. Denies any systemic complaints such as fevers, chills, nausea, vomiting. No acute changes since last appointment, and no other complaints at this time.   Objective: AAO x3, NAD DP/PT pulses palpable bilaterally, CRT less than 3 seconds There is tenderness on the second interspace however there is no significant neuroma palpable today on the left foot. There does to be numbness in this area subjectively there is localized swelling to this area. There is no area pinpoint bony tess and there is no pain with vibratory sensation. No other areas of tenderness are identified. No open lesions or pre-ulcerative lesions.  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  -at this time a discussed a steroid injection and she wishes to proceed. Under sterile conditions a mixture of Kenalog and local anesthetic was infiltrated into the second interspace of the left foot without any complications. Post injection care was discussed. -Prescribed mobic. Discussed side effects of the medication and directed to stop if any are to occur and call the office.  -Metatarsal offloading pad dispensed. -Dicussed change in shoes and inserts.  -Patient encouraged to call the office with any questions, concerns, change in symptoms.   Celesta Gentile, DPM

## 2016-11-08 ENCOUNTER — Ambulatory Visit (INDEPENDENT_AMBULATORY_CARE_PROVIDER_SITE_OTHER): Payer: 59 | Admitting: Podiatry

## 2016-11-08 ENCOUNTER — Telehealth: Payer: Self-pay | Admitting: *Deleted

## 2016-11-08 DIAGNOSIS — M779 Enthesopathy, unspecified: Secondary | ICD-10-CM | POA: Diagnosis not present

## 2016-11-08 DIAGNOSIS — D3610 Benign neoplasm of peripheral nerves and autonomic nervous system, unspecified: Secondary | ICD-10-CM

## 2016-11-08 NOTE — Telephone Encounter (Addendum)
-----   Message from Trula Slade, DPM sent at 11/08/2016  8:46 AM EDT ----- Can you please order a diagnostic ultrasound of her left foot to look for an ultrasound of her left foot, to look for a neuroma of the 2nd intersapce? Thanks. Orders faxed to [facility].11/08/2016-Destiny - [facility] request a call back concerning pt.11/09/2016-I spoke with [facility], she states pt's MRI should be scheduled in [place]. I spoke with pt and she states she had not requested to be seen in [place] and would prefer [facility]. I spoke with [place] and she states she felt it was something technical as to why pt could not have the [place] in [facility] center, and will have Destiny or tech contact me. I informed pt that [facility] only had assistance for [place] on Monday, and if pt wanted to schedule on other days she would need to go to [place]. Pt states she will go to [facility]. I faxed new orders to [facility] and cancellation orders to [facility].11/11/2016-Pt states [facility] can not work with her schedule, so would like to go back to the [facility]. Faxed [place] to [facility]. 11/15/2016-Destiny - [facility] asked if this was to be scheduled in [facility], and I told her pt was told by [facility] [place], they did not hav the staff to perform the [place], and referred to [facility]. Destiny states she will call pt and get scheduled with them.

## 2016-11-08 NOTE — Progress Notes (Signed)
Subjective: Ms. Lukes presents the office today for follow-up evaluation of left foot pain to she states has not really pain which she expects this point states is more uncomfortable this is been ongoing for a couple years. She's concerned as mature starting to come that she is made aware more close in shoes which were her symptoms 10 times worse. She states the injection did help as well as anti-inflammatories and the pain is improved but she has still noticed. Denies any systemic complaints such as fevers, chills, nausea, vomiting. No acute changes since last appointment, and no other complaints at this time.   Objective: AAO x3, NAD DP/PT pulses palpable bilaterally, CRT less than 3 seconds This continuation of tenderness the second interspace of the left foot however I'm not able to palpate a neuroma or any fluid today. She still getting some localized swelling to this area but is also improved compared to last appointment. She also describes a sharp pain into her toes at times.  No open lesions or pre-ulcerative lesions.  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  -At this point I recommended an ultrasound of the area to evaluate for neuroma. As a ongoing for some time I think advanced imaging is wanted this point. Based on this week and discuss further treatment options discussed with her different types of injections, custom orthotics. I could result of the ultrasound before proceeding with this. She agrees to this plan. -Patient encouraged to call the office with any questions, concerns, change in symptoms.   Celesta Gentile, DPM

## 2016-11-09 NOTE — Telephone Encounter (Signed)
-----   Message from Katha Hamming sent at 11/09/2016 10:41 AM EDT ----- Regarding: limited ultrasound Hey Gianny Killman:  Destiny talked with Ivin Booty our ultrasound technologist about this ultrasound and she said that the radiologist told her on Monday that these studies need to go to [place] because she had the same type of order on a patient Monday.   The radiologist recommends for them to go [place] so the radiologist will be available to look at the ultrasound or assist.     She did say if the patient insists on coming here, she would be glad to do it.   Thanks, Hoyle Sauer

## 2016-11-16 ENCOUNTER — Ambulatory Visit (HOSPITAL_BASED_OUTPATIENT_CLINIC_OR_DEPARTMENT_OTHER)
Admission: RE | Admit: 2016-11-16 | Discharge: 2016-11-16 | Disposition: A | Discharge status: Home / Self Care | Payer: 59 | Source: Ambulatory Visit | Attending: Podiatry | Admitting: Podiatry

## 2016-11-16 DIAGNOSIS — R936 Abnormal findings on diagnostic imaging of limbs: Secondary | ICD-10-CM | POA: Diagnosis not present

## 2016-11-16 DIAGNOSIS — D3610 Benign neoplasm of peripheral nerves and autonomic nervous system, unspecified: Secondary | ICD-10-CM | POA: Diagnosis not present

## 2016-11-16 DIAGNOSIS — M779 Enthesopathy, unspecified: Secondary | ICD-10-CM | POA: Diagnosis not present

## 2016-11-18 ENCOUNTER — Telehealth: Payer: Self-pay | Admitting: Podiatry

## 2016-11-18 NOTE — Telephone Encounter (Signed)
Spoke with pt. She will call to schedule appt

## 2016-11-22 ENCOUNTER — Encounter: Payer: Self-pay | Admitting: Podiatry

## 2016-11-22 ENCOUNTER — Ambulatory Visit (INDEPENDENT_AMBULATORY_CARE_PROVIDER_SITE_OTHER): Payer: 59 | Admitting: Podiatry

## 2016-11-22 DIAGNOSIS — M779 Enthesopathy, unspecified: Secondary | ICD-10-CM | POA: Diagnosis not present

## 2016-11-22 NOTE — Progress Notes (Signed)
Subjective: Kelli Brandt presents the office today for follow-up evaluation of left foot pain which has been chronic at this point. She states that she is concerned of having to go back into closing shoes this winter as this is what really aggravates her symptoms. She still gets some occasional numbness her second third toes again this been chronic. She presents today to discuss ultrasound results. Denies any systemic complaints such as fevers, chills, nausea, vomiting. No acute changes since last appointment, and no other complaints at this time.   Objective: AAO x3, NAD DP/PT pulses palpable bilaterally, CRT less than 3 seconds This continuation of tenderness the second interspace of the left foot however I'm not able to palpate a neuroma or any fluid today. There is no subsidence get swelling to this area. Subjectively she is doing some numbness to her toes. There is no area pinpoint bony tenderness or pain the vibratory sensation. No open lesions or pre-ulcerative lesions.  No pain with calf compression, swelling, warmth, erythema  Ultrasound 11/16/2016: 4 mm hypoechoic area seen in the plantar region in the first interspace which may represent small fluid collection or cyst. No solid mass is noted.  Assessment: Fluid second interspace left foot, likely capsulitis left tendinitis  Plan: -All treatment options discussed with the patient including all alternatives, risks, complications.  -Ultrasound results were discussed the patient. At this point believe that her symptoms are biomechanical in nature. This has been a chronic issue. When I try for custom inserts to help take pressure off this area. She is molded orthotics today. We will check insurance coverage before ordering. If we cannot to this we'll try an over-the-counter insert with modifications to help take pressure off. As the steroid injection did not help long term after last appointment will hold off on any further steroid injection at  this point. -Follow-up once orthotics arrive or sooner if needed.  Celesta Gentile, DPM

## 2017-02-20 ENCOUNTER — Encounter: Payer: 59 | Admitting: Internal Medicine

## 2017-02-23 ENCOUNTER — Encounter: Payer: Self-pay | Admitting: Family Medicine

## 2017-04-01 ENCOUNTER — Encounter (HOSPITAL_BASED_OUTPATIENT_CLINIC_OR_DEPARTMENT_OTHER): Payer: Self-pay | Admitting: *Deleted

## 2017-04-01 ENCOUNTER — Other Ambulatory Visit: Payer: Self-pay

## 2017-04-01 ENCOUNTER — Emergency Department (HOSPITAL_BASED_OUTPATIENT_CLINIC_OR_DEPARTMENT_OTHER)
Admission: EM | Admit: 2017-04-01 | Discharge: 2017-04-01 | Disposition: A | Discharge status: Home / Self Care | Payer: No Typology Code available for payment source | Attending: Emergency Medicine | Admitting: Emergency Medicine

## 2017-04-01 ENCOUNTER — Emergency Department (HOSPITAL_BASED_OUTPATIENT_CLINIC_OR_DEPARTMENT_OTHER): Payer: No Typology Code available for payment source

## 2017-04-01 DIAGNOSIS — Z79899 Other long term (current) drug therapy: Secondary | ICD-10-CM | POA: Diagnosis not present

## 2017-04-01 DIAGNOSIS — E119 Type 2 diabetes mellitus without complications: Secondary | ICD-10-CM | POA: Insufficient documentation

## 2017-04-01 DIAGNOSIS — M545 Low back pain, unspecified: Secondary | ICD-10-CM

## 2017-04-01 DIAGNOSIS — Z7982 Long term (current) use of aspirin: Secondary | ICD-10-CM | POA: Insufficient documentation

## 2017-04-01 DIAGNOSIS — I10 Essential (primary) hypertension: Secondary | ICD-10-CM | POA: Diagnosis not present

## 2017-04-01 HISTORY — DX: Calculus of kidney: N20.0

## 2017-04-01 HISTORY — DX: Other cervical disc degeneration, unspecified cervical region: M50.30

## 2017-04-01 LAB — URINALYSIS, MICROSCOPIC (REFLEX)

## 2017-04-01 LAB — URINALYSIS, ROUTINE W REFLEX MICROSCOPIC
Bilirubin Urine: NEGATIVE
Glucose, UA: 100 mg/dL — AB
Ketones, ur: 15 mg/dL — AB
NITRITE: NEGATIVE
PH: 5.5 (ref 5.0–8.0)
Protein, ur: 30 mg/dL — AB

## 2017-04-01 MED ORDER — TRAMADOL HCL 50 MG PO TABS: 50.0000 mg | ORAL_TABLET | Freq: Four times a day (QID) | ORAL | 0 refills | Status: DC | PRN

## 2017-04-01 MED ORDER — HYDROCODONE-ACETAMINOPHEN 5-325 MG PO TABS
1.0000 | ORAL_TABLET | Freq: Once | ORAL | Status: AC
  Administered 2017-04-01: 1 via ORAL
  Filled 2017-04-01: qty 1

## 2017-04-01 MED ORDER — CYCLOBENZAPRINE HCL 10 MG PO TABS: 10.0000 mg | ORAL_TABLET | Freq: Two times a day (BID) | ORAL | 0 refills | Status: DC | PRN

## 2017-04-01 NOTE — ED Notes (Signed)
NAD at this time. Pt is stable and going home.  

## 2017-04-01 NOTE — ED Provider Notes (Signed)
[facility] EMERGENCY DEPARTMENT Provider Note   CSN: [id number] Arrival date & time: 04/01/17  1121     History   Chief Complaint Chief Complaint  Patient presents with  . Fall    HPI Kelli Brandt is a 52 y.o. female.  HPI   52 year old female presents status post fall.  Patient reports she slipped on a basketball that was on the floor today landing on her back.  She notes pain to the bilateral lower lumbar region, no significant midline tenderness.  Patient notes a history of back pain requiring discectomy.  She notes prior to the fall she had no significant back pain.  She notes symptoms had improved after taking medication at home.  She denies any distal neurological deficits, bowel or bladder incontinence, upper back pain, or any other injuries from the fall.  Patient denies any dysuria or urinary symptoms.  Past Medical History:  Diagnosis Date  . Allergy   . DDD (degenerative disc disease), cervical   . Diabetes mellitus without complication ([place])   . Hypertension   . Kidney stones     Patient Active Problem List   Diagnosis Date Noted  . Foreign body in left foot 04/26/2016  . Bilateral bunions 04/26/2016  . DIABETES MELLITUS-TYPE II 03/18/2009  . HYPERTENSION 03/18/2009  . HEMORRHOIDS-INTERNAL 03/18/2009  . HEMORRHOIDS-EXTERNAL 03/18/2009  . ROSACEA 03/18/2009  . NONSPEC ABN FINDNG RAD & OTH EXAM ABDOMINAL AREA 03/18/2009    Past Surgical History:  Procedure Laterality Date  . BACK SURGERY  1998  . CHOLECYSTECTOMY    . DILATION AND CURETTAGE OF UTERUS     Novasure  . KIDNEY STONE SURGERY    . LITHOTRIPSY     x 2   . URETHRAL DILATION      OB History    No data available       Home Medications    Prior to Admission medications   Medication Sig Start Date End Date Taking? Authorizing Provider  Adalimumab (HUMIRA PEN [place]) Inject into the skin.   Yes [provider]  allopurinol (ZYLOPRIM) 300 MG tablet  04/11/16  Yes  [provider]  Alogliptin-Metformin HCl (KAZANO) 12.07-998 MG TABS Take by mouth.   Yes [provider]  aspirin EC 81 MG tablet Take 81 mg by mouth daily.   Yes [provider]  esomeprazole (NEXIUM) 20 MG capsule Take 20 mg by mouth daily at 12 noon.   Yes [provider]  fexofenadine (ALLEGRA) 60 MG tablet Take 60 mg by mouth 2 (two) times daily.   Yes [provider]  levothyroxine (SYNTHROID, LEVOTHROID) 88 MCG tablet Take 88 mcg by mouth daily before breakfast.   Yes [provider]  liraglutide (VICTOZA) 18 MG/3ML SOPN Inject into the skin.   Yes [provider]  potassium citrate (UROCIT-K) 10 MEQ (1080 MG) SR tablet Take 10 mEq by mouth 3 (three) times daily with meals.   Yes [provider]  SIMVASTATIN PO Take by mouth.   Yes [provider]  triamcinolone (NASACORT) 55 MCG/ACT AERO nasal inhaler Place 2 sprays into the nose daily.   Yes [provider]  valACYclovir (VALTREX) 1000 MG tablet Take 1,000 mg by mouth 2 (two) times daily.   Yes [provider]  cephALEXin (KEFLEX) 500 MG capsule Take 1 capsule (500 mg total) by mouth 3 (three) times daily. 04/26/16   Trula Slade, DPM  lisinopril-hydrochlorothiazide (PRINZIDE,ZESTORETIC) 20-12.5 MG tablet Take 1 tablet by mouth  daily.    [provider]  meloxicam (MOBIC) 15 MG tablet Take 1 tablet (15 mg total) by mouth daily. 10/25/16 10/25/17  Trula Slade, DPM  traMADol (ULTRAM) 50 MG tablet Take 1 tablet (50 mg total) by mouth every 6 (six) hours as needed. 04/01/17   Okey Regal, PA-C    Family History No family history on file.  Social History Social History   Tobacco Use  . Smoking status: Never Smoker  . Smokeless tobacco: Never Used  Substance Use Topics  . Alcohol use: Not on file  . Drug use: Not on file     Allergies   Patient has no known allergies.   Review of Systems Review of Systems    All other systems reviewed and are negative.    Physical Exam Updated Vital Signs BP 100/68 (BP Location: Right Arm)   Pulse 96   Temp 98.4 F (36.9 C) (Oral)   Resp 18   Ht 5\' 4"  (1.626 m)   Wt 71.7 kg (158 lb)   SpO2 98%   BMI 27.12 kg/m   Physical Exam  Constitutional: She is oriented to person, place, and time. She appears well-developed and well-nourished.  HENT:  Head: Normocephalic and atraumatic.  Eyes: Conjunctivae are normal. Pupils are equal, round, and reactive to light. Right eye exhibits no discharge. Left eye exhibits no discharge. No scleral icterus.  Neck: Normal range of motion. No JVD present. No tracheal deviation present.  Pulmonary/Chest: Effort normal. No stridor.  Abdominal: Soft. She exhibits no distension. There is no tenderness.  Musculoskeletal:  No C or T-spine tenderness to palpation, minor tenderness to palpation of the midline lumbar region and bilateral musculature, nonfocal.  Bilateral upper and lower sensation strength and motor function intact  Neurological: She is alert and oriented to person, place, and time. Coordination normal.  Psychiatric: She has a normal mood and affect. Her behavior is normal. Judgment and thought content normal.  Nursing note and vitals reviewed.    ED Treatments / Results  Labs (all labs ordered are listed, but only abnormal results are displayed) Labs Reviewed  URINALYSIS, ROUTINE W REFLEX MICROSCOPIC - Abnormal; Notable for the following components:      Result Value   APPearance HAZY (*)    Specific Gravity, Urine >1.030 (*)    Glucose, UA 100 (*)    Hgb urine dipstick TRACE (*)    Ketones, ur 15 (*)    Protein, ur 30 (*)    Leukocytes, UA TRACE (*)    All other components within normal limits  URINALYSIS, MICROSCOPIC (REFLEX) - Abnormal; Notable for the following components:   Bacteria, UA MANY (*)    Squamous Epithelial / LPF 0-5 (*)    All other components within normal limits  URINE CULTURE     EKG  EKG Interpretation None       Radiology Dg Lumbar Spine Complete  Result Date: 04/01/2017 CLINICAL DATA:  Fall.  Back pain EXAM: LUMBAR SPINE - COMPLETE 4+ VIEW COMPARISON:  CT abdomen pelvis 08/03/2007, KUB 02/14/2017 FINDINGS: Normal lumbar alignment. Negative for fracture or mass. Advanced disc degeneration at L5-S1 with disc space narrowing and spurring. Remaining disc spaces intact 15 mm left lower pole renal calculus unchanged from the prior KUB. Surgical clips in the gallbladder fossa. Moderate stool in the right colon. IMPRESSION: Advanced disc degeneration and spondylosis L5-S1 15 mm left lower pole renal calculus Constipation Electronically Signed   By: Franchot Gallo M.D.   On:  04/01/2017 15:21    Procedures Procedures (including critical care time)  Medications Ordered in ED Medications  HYDROcodone-acetaminophen (NORCO/VICODIN) 5-325 MG per tablet 1 tablet (not administered)     Initial Impression / Assessment and Plan / ED Course  I have reviewed the triage vital signs and the nursing notes.  Pertinent labs & imaging results that were available during my care of the patient were reviewed by me and considered in my medical decision making (see chart for details).      Final Clinical Impressions(s) / ED Diagnoses   Final diagnoses:  Acute bilateral low back pain without sciatica   Labs: Urinalysis, urine microscopic  Imaging:  Consults:  Therapeutics:  Discharge Meds: Ultram  Assessment/Plan: 52 year old female presents status post fall.  Patient reporting lower back pain.  She has no red flags, no acute findings on plain films.  Urinalysis was ordered prior to my evaluation.  Shows trace leukocytes, many bacteria, patient is asymptomatic from this, no treatment necessary at this time, she will have a urine culture sent.  Patient does have history of kidney stones and is followed by urology.      ED Discharge Orders        Ordered     traMADol (ULTRAM) 50 MG tablet  Every 6 hours PRN     04/01/17 1611       Okey Regal, PA-C 04/01/17 1618    Fredia Sorrow, MD 04/02/17 [id number]

## 2017-04-01 NOTE — Discharge Instructions (Signed)
Please read attached information. If you experience any new or worsening signs or symptoms please return to the emergency room for evaluation. Please follow-up with your primary care provider or specialist as discussed. Please use medication prescribed only as directed and discontinue taking if you have any concerning signs or symptoms.   °

## 2017-04-01 NOTE — ED Triage Notes (Signed)
Patient states she tripped over a ball and fall this morning and  landed on her back.  Now has pain in has pain in the mid to lower back bilaterally.  Took ibuprofen and hydrocodone with no relief.

## 2017-07-24 ENCOUNTER — Ambulatory Visit: Payer: 59 | Admitting: Podiatry

## 2017-09-13 ENCOUNTER — Telehealth: Payer: Self-pay | Admitting: Podiatry

## 2017-09-13 NOTE — Telephone Encounter (Signed)
Left message for pt that upon checking her benefits for orthotics they are an exclusion from her plan and to call me back if any questions.

## 2018-03-05 ENCOUNTER — Other Ambulatory Visit: Payer: Self-pay | Admitting: Family Medicine

## 2018-03-05 DIAGNOSIS — R110 Nausea: Secondary | ICD-10-CM

## 2018-03-05 DIAGNOSIS — R1900 Intra-abdominal and pelvic swelling, mass and lump, unspecified site: Secondary | ICD-10-CM

## 2018-03-05 DIAGNOSIS — R634 Abnormal weight loss: Secondary | ICD-10-CM

## 2018-03-05 DIAGNOSIS — R1901 Right upper quadrant abdominal swelling, mass and lump: Secondary | ICD-10-CM

## 2018-03-08 ENCOUNTER — Other Ambulatory Visit: Payer: Self-pay | Admitting: Family Medicine

## 2018-03-12 ENCOUNTER — Ambulatory Visit
Admission: RE | Admit: 2018-03-12 | Discharge: 2018-03-12 | Disposition: A | Discharge status: Home / Self Care | Payer: No Typology Code available for payment source | Source: Ambulatory Visit | Attending: Family Medicine | Admitting: Family Medicine

## 2018-03-12 ENCOUNTER — Other Ambulatory Visit: Payer: No Typology Code available for payment source

## 2018-03-12 DIAGNOSIS — R110 Nausea: Secondary | ICD-10-CM

## 2018-03-12 DIAGNOSIS — R634 Abnormal weight loss: Secondary | ICD-10-CM

## 2018-03-12 DIAGNOSIS — R1901 Right upper quadrant abdominal swelling, mass and lump: Secondary | ICD-10-CM

## 2018-03-12 DIAGNOSIS — R1900 Intra-abdominal and pelvic swelling, mass and lump, unspecified site: Secondary | ICD-10-CM

## 2018-03-12 MED ORDER — IOPAMIDOL (ISOVUE-300) INJECTION 61%
100.0000 mL | Freq: Once | INTRAVENOUS | Status: AC | PRN
  Administered 2018-03-12: 100 mL via INTRAVENOUS

## 2018-04-19 ENCOUNTER — Encounter

## 2018-04-19 ENCOUNTER — Other Ambulatory Visit (INDEPENDENT_AMBULATORY_CARE_PROVIDER_SITE_OTHER): Payer: No Typology Code available for payment source

## 2018-04-19 ENCOUNTER — Encounter: Payer: Self-pay | Admitting: Nurse Practitioner

## 2018-04-19 ENCOUNTER — Ambulatory Visit: Payer: No Typology Code available for payment source | Admitting: Nurse Practitioner

## 2018-04-19 VITALS — BP 90/60 | HR 102 | Ht 64.0 in | Wt 155.0 lb

## 2018-04-19 DIAGNOSIS — D649 Anemia, unspecified: Secondary | ICD-10-CM

## 2018-04-19 LAB — CBC
Hemoglobin: 8.3 g/dL — ABNORMAL LOW (ref 12.0–15.0)
MCHC: 31.7 g/dL (ref 30.0–36.0)
MCV: 67.8 fl — ABNORMAL LOW (ref 78.0–100.0)
PLATELETS: 276 10*3/uL (ref 150.0–400.0)
RBC: 3.86 Mil/uL — ABNORMAL LOW (ref 3.87–5.11)
RDW: 16.2 % — ABNORMAL HIGH (ref 11.5–15.5)
WBC: 7.5 10*3/uL (ref 4.0–10.5)

## 2018-04-19 MED ORDER — NA SULFATE-K SULFATE-MG SULF 17.5-3.13-1.6 GM/177ML PO SOLN: ORAL | 0 refills | Status: DC

## 2018-04-19 NOTE — Patient Instructions (Signed)
If you are age 52 or older, your body mass index should be between 23-30. Your Body mass index is 26.61 kg/m. If this is out of the aforementioned range listed, please consider follow up with your Primary Care Provider.  If you are age 8 or younger, your body mass index should be between 19-25. Your Body mass index is 26.61 kg/m. If this is out of the aformentioned range listed, please consider follow up with your Primary Care Provider.   You have been scheduled for an endoscopy and colonoscopy. Please follow the written instructions given to you at your visit today. Please pick up your prep supplies at the pharmacy within the next 1-3 days. If you use inhalers (even only as needed), please bring them with you on the day of your procedure. Your physician has requested that you go to www.startemmi.com and enter the access code given to you at your visit today. This web site gives a general overview about your procedure. However, you should still follow specific instructions given to you by our office regarding your preparation for the procedure.  We have sent the following medications to your pharmacy for you to pick up at your convenience: [place] provider has requested that you go to the basement level for lab work before leaving today. Press "B" on the elevator. The lab is located at the first door on the left as you exit the elevator.  Thank you for choosing me and [facility].   Tye Savoy, NP

## 2018-04-19 NOTE — Progress Notes (Signed)
Agree with assessment and plan.  B12 and iron deficiency anemia.  On replacement therapies.  Plans for colonoscopy and upper endoscopy as noted.  If examination is negative consider capsule endoscopy.

## 2018-04-19 NOTE — Progress Notes (Signed)
ASSESSMENT / PLAN:   79.  53 year old female evaluation of recurrent iron deficiency anemia. She was evaluated for microcytic anemia back in 2007, EGD and colonoscopy unrevealing at that time. No overt GI bleeding. -Requested records from PCP while patient was in clinic.  We were able to receive labs, she is iron deficient. Hemoglobin down from baseline of 13 to 8 .  - Will schedule patient for EGD and colonoscopy for evaluation of anemia. The risks and benefits of colonoscopy with possible polypectomy and EGD were discussed and the patient agrees to proceed.  -Repeat CBC today.    2. B12 deficiency.  Recent B12 level 100.  PCP has started her on B12 supplements  3. GERD, longstanding.  Overall asymptomatic on daily PPI  4.  Enlarged right liver lobe on exam.   CTAP with contrast scan 03/05/2018 and right liver lobe appeared enlarged but there was Reidel's morphology (?normal variant).    HPI:    Chief Complaint:   Colon cancer screening and new anemia  Patient is a 53 year old female with diabetes, gout, GERD, hypothyroidism and hypertension.  She is known remotely to Dr. Henrene Pastor.  She had a EGD colonoscopy in 2007 for evaluation of microcytic anemia.  Colonoscopy unrevealing except for hemorrhoids.  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  No evidence for Barrett's.  Hiatal hernia.  Small bowel biopsies done to rule out sprue, results not available in epic.    Patient referred back by PCP, Cari Caraway, MD for recurrent anemia.  Patient has not had any overt GI blood loss.  No abdominal pain, no unusual weight loss.  She was having some nausea but that was felt to be secondary to one of her diabetic medications which has since been discontinued with resolution of nausea.  Patient takes a daily baby aspirin, otherwise  significant NSAID use, not on blood thinners.  Patient believes the drop in hemoglobin from 13-8 occurred over a month or so.  She  has not had any dizziness or shortness of breath . Weight is stable. She has a history of GERD on chronic PPI and asymptomatic for the most part.  No lower GI complaints.  She takes stool softeners every day and bowel movements are overall normal.  Data Reviewed:  CTAP with contrast 02/23/18 Hepatobiliary: Borderline liver surface lobulation but no caudate lobe enlargement or fissure enlargement to implicate cirrhosis. Large appearance of the right liver, but there is a Reidel's morphology. Cholecystectomy with normal common bile duct diameter  Labs 04/05/18 Ferritin low at 6.2, TIBC elevated at 643, iron saturation 3.  Hemoglobin 8.   Past Medical History:  Diagnosis Date  . Allergy   . DDD (degenerative disc disease), cervical   . Diabetes mellitus without complication ([place])   . Hypertension   . Hyperthyroidism   . Kidney stones      Past Surgical History:  Procedure Laterality Date  . BACK SURGERY  1998  . CHOLECYSTECTOMY    . DILATION AND CURETTAGE OF UTERUS     Novasure  . KIDNEY STONE SURGERY    . LITHOTRIPSY     x 2   . URETHRAL DILATION     Family History  Problem Relation Age of Onset  . Breast cancer Mother   . Alzheimer's disease Mother   . Colon cancer Neg Hx    Social History   Tobacco Use  .  Smoking status: Never Smoker  . Smokeless tobacco: Never Used  Substance Use Topics  . Alcohol use: Never    Frequency: Never  . Drug use: Never   Current Outpatient Medications  Medication Sig Dispense Refill  . acetaminophen (TYLENOL) 325 MG tablet Take 650 mg by mouth as needed.    . Adalimumab (HUMIRA PEN [place]) Inject into the skin.    Marland Kitchen allopurinol (ZYLOPRIM) 300 MG tablet     . Alogliptin-Metformin HCl (KAZANO) 12.07-998 MG TABS Take by mouth.    Marland Kitchen aspirin EC 81 MG tablet Take 81 mg by mouth daily.    Marland Kitchen docusate sodium (COLACE) 50 MG capsule Take 100 mg by mouth daily.    . fexofenadine (ALLEGRA) 60 MG tablet Take 60 mg by mouth 2 (two) times daily.      . Insulin Glargine (BASAGLAR KWIKPEN) 100 UNIT/ML SOPN Inject 30 Units into the skin 1 day or 1 dose.    . levothyroxine (SYNTHROID, LEVOTHROID) 88 MCG tablet Take 88 mcg by mouth daily before breakfast.    . lisinopril-hydrochlorothiazide (PRINZIDE,ZESTORETIC) 20-12.5 MG tablet Take 0.5 tablets by mouth daily.     . pantoprazole (PROTONIX) 40 MG tablet Take 40 mg by mouth daily.    . potassium citrate (UROCIT-K) 10 MEQ (1080 MG) SR tablet Take 10 mEq by mouth 3 (three) times daily with meals.    Marland Kitchen SIMVASTATIN PO Take by mouth.    . triamcinolone (NASACORT) 55 MCG/ACT AERO nasal inhaler Place 2 sprays into the nose daily.    . valACYclovir (VALTREX) 1000 MG tablet Take 1,000 mg by mouth 2 (two) times daily.    Marland Kitchen esomeprazole (NEXIUM) 20 MG capsule Take 20 mg by mouth daily at 12 noon.     No current facility-administered medications for this visit.    No Known Allergies   Review of Systems: All systems reviewed and negative except where noted in HPI.   Creatinine clearance cannot be calculated (Patient's most recent lab result is older than the maximum 21 days allowed.)   Physical Exam:    Wt Readings from Last 3 Encounters:  04/19/18 155 lb (70.3 kg)  04/01/17 158 lb (71.7 kg)    BP 90/60   Pulse (!) 102   Ht 5\' 4"  (1.626 m)   Wt 155 lb (70.3 kg)   BMI 26.61 kg/m  Constitutional:  Pleasant female in no acute distress. Psychiatric: Normal mood and affect. Behavior is normal. EENT: Pupils normal.  Conjunctivae are normal. No scleral icterus. Neck supple.  Cardiovascular: Normal rate, regular rhythm. No edema Pulmonary/chest: Effort normal and breath sounds normal. No wheezing, rales or rhonchi. Abdominal: Soft, nondistended, nontender. Bowel sounds active throughout. Enlarged right liver lobe.  Neurological: Alert and oriented to person place and time. Skin: Skin is warm and dry. No rashes noted.  Tye Savoy, NP  04/19/2018, 8:46 AM  Cc: Cari Caraway, MD

## 2018-04-26 ENCOUNTER — Encounter: Payer: Self-pay | Admitting: Family Medicine

## 2018-05-03 ENCOUNTER — Ambulatory Visit: Payer: No Typology Code available for payment source | Admitting: Internal Medicine

## 2018-05-08 ENCOUNTER — Ambulatory Visit (AMBULATORY_SURGERY_CENTER): Payer: No Typology Code available for payment source | Admitting: Internal Medicine

## 2018-05-08 ENCOUNTER — Encounter: Payer: Self-pay | Admitting: Internal Medicine

## 2018-05-08 VITALS — BP 98/62 | HR 78 | Temp 98.6°F | Resp 13 | Ht 64.0 in | Wt 155.0 lb

## 2018-05-08 DIAGNOSIS — D649 Anemia, unspecified: Secondary | ICD-10-CM

## 2018-05-08 DIAGNOSIS — D123 Benign neoplasm of transverse colon: Secondary | ICD-10-CM

## 2018-05-08 DIAGNOSIS — K3189 Other diseases of stomach and duodenum: Secondary | ICD-10-CM

## 2018-05-08 DIAGNOSIS — K219 Gastro-esophageal reflux disease without esophagitis: Secondary | ICD-10-CM

## 2018-05-08 DIAGNOSIS — D3A092 Benign carcinoid tumor of the stomach: Secondary | ICD-10-CM

## 2018-05-08 DIAGNOSIS — D500 Iron deficiency anemia secondary to blood loss (chronic): Secondary | ICD-10-CM

## 2018-05-08 MED ORDER — PANTOPRAZOLE SODIUM 40 MG PO TBEC: 40.0000 mg | DELAYED_RELEASE_TABLET | Freq: Every day | ORAL | 3 refills | Status: DC

## 2018-05-08 MED ORDER — SODIUM CHLORIDE 0.9 % IV SOLN: 500.0000 mL | Freq: Once | INTRAVENOUS | Status: DC

## 2018-05-08 NOTE — Progress Notes (Signed)
Pt awake. VSS. Report given to RN. No anesthetic complications noted 

## 2018-05-08 NOTE — Op Note (Signed)
[facility] Patient Name: Kelli Brandt Procedure Date: 05/08/2018 3:00 PM MRN: [id number] Endoscopist: Docia Chuck. Henrene Pastor , MD Age: 53 Referring MD:  Date of Birth: 1965-07-30 Gender: Female Account #: [id number] Procedure:                Upper GI endoscopy with biopsy; with control of                            bleeding (clip) Indications:              Iron deficiency anemia Medicines:                Monitored Anesthesia Care Procedure:                Pre-Anesthesia Assessment:                           - Prior to the procedure, a History and Physical                            was performed, and patient medications and                            allergies were reviewed. The patient's tolerance of                            previous anesthesia was also reviewed. The risks                            and benefits of the procedure and the sedation                            options and risks were discussed with the patient.                            All questions were answered, and informed consent                            was obtained. Prior Anticoagulants: The patient has                            taken no previous anticoagulant or antiplatelet                            agents. ASA Grade Assessment: II - A patient with                            mild systemic disease. After reviewing the risks                            and benefits, the patient was deemed in                            satisfactory condition to undergo the procedure.  After obtaining informed consent, the endoscope was                            passed under direct vision. Throughout the                            procedure, the patient's blood pressure, pulse, and                            oxygen saturations were monitored continuously. The                            Endoscope was introduced through the mouth, and                            advanced to the second part of duodenum.  The upper                            GI endoscopy was accomplished without difficulty.                            The patient tolerated the procedure well. Scope In: Scope Out: Findings:                 The esophagus was normal.                           The stomach revealed a 5 mm raised nodule with                            central ulceration. Biopsies were taken with a cold                            forceps for histology. There was persistent oozing                            after the biopsy, one hemostatic clip was                            successfully placed. There was no bleeding at the                            end of the procedure. Stomach was otherwise normal                           The examined duodenum was normal. Biopsies for                            histology were taken with a cold forceps for                            evaluation of celiac disease.  The cardia and gastric fundus were normal on                            retroflexion. Complications:            No immediate complications. Estimated Blood Loss:     Estimated blood loss: none. Impression:               1. Gastric nodule biopsied. Status post hemostatic                            clip placement for post biopsy bleeding                           2. Otherwise normal exam status post biopsies for                            celiac disease. Recommendation:           1. Continue B12 and iron replacement                           2. Repeat CBC, ferritin, and B12 level in 4 weeks                           3. Follow-up biopsies                           4. Office follow-up with Dr. Henrene Pastor in 4 to 6 weeks. Docia Chuck. Henrene Pastor, MD 05/08/2018 3:50:11 PM This report has been signed electronically.

## 2018-05-08 NOTE — Op Note (Signed)
[place] Patient Name: Kelli Brandt Procedure Date: 05/08/2018 3:00 PM MRN: [id number] Endoscopist: Docia Chuck. Henrene Pastor , MD Age: 53 Referring MD:  Date of Birth: [birth year] Gender: Female Account #: [id number] Procedure:                Colonoscopy cold snare polypectomy x 1 Indications:              Iron deficiency anemia. Colonoscopy 2007 for the                            same was negative Medicines:                Monitored Anesthesia Care Procedure:                Pre-Anesthesia Assessment:                           - Prior to the procedure, a History and Physical                            was performed, and patient medications and                            allergies were reviewed. The patient's tolerance of                            previous anesthesia was also reviewed. The risks                            and benefits of the procedure and the sedation                            options and risks were discussed with the patient.                            All questions were answered, and informed consent                            was obtained. Prior Anticoagulants: The patient has                            taken no previous anticoagulant or antiplatelet                            agents. ASA Grade Assessment: II - A patient with                            mild systemic disease. After reviewing the risks                            and benefits, the patient was deemed in                            satisfactory condition to undergo the procedure.  After obtaining informed consent, the colonoscope                            was passed under direct vision. Throughout the                            procedure, the patient's blood pressure, pulse, and                            oxygen saturations were monitored continuously. The                            Colonoscope was introduced through the anus and                            advanced to the the  cecum, identified by                            appendiceal orifice and ileocecal valve. The                            terminal ileum, ileocecal valve, appendiceal                            orifice, and rectum were photographed. The quality                            of the bowel preparation was excellent. The                            colonoscopy was performed without difficulty. The                            patient tolerated the procedure well. The bowel                            preparation used was SUPREP. Scope In: 3:10:57 PM Scope Out: 3:27:30 PM Scope Withdrawal Time: 0 hours 11 minutes 6 seconds  Total Procedure Duration: 0 hours 16 minutes 33 seconds  Findings:                 The terminal ileum appeared normal.                           A 3 mm polyp was found in the transverse colon. The                            polyp was removed with a cold snare. Resection and                            retrieval were complete.                           An area of mild melanosis was found in the entire  colon.                           Internal hemorrhoids were found during retroflexion.                           The exam was otherwise without abnormality on                            direct and retroflexion views. Complications:            No immediate complications. Estimated blood loss:                            None. Estimated Blood Loss:     Estimated blood loss: none. Impression:               - The examined portion of the ileum was normal.                           - One 3 mm polyp in the transverse colon, removed                            with a cold snare. Resected and retrieved.                           - Melanosis in the colon.                           - Internal hemorrhoids.                           - The examination was otherwise normal on direct                            and retroflexion views. Recommendation:           - Repeat  colonoscopy in 7-10 years for surveillance.                           - Patient has a contact number available for                            emergencies. The signs and symptoms of potential                            delayed complications were discussed with the                            patient. Return to normal activities tomorrow.                            Written discharge instructions were provided to the                            patient.                           -  Resume previous diet.                           - Continue present medications.                           - Await pathology results. Docia Chuck. Henrene Pastor, MD 05/08/2018 3:31:52 PM This report has been signed electronically.

## 2018-05-08 NOTE — Patient Instructions (Signed)
Thank you for allowing [place] to care for you today!  Await pathology results by mail, approximately 2 weeks.  Recommendation for next colonoscopy in 7 or 10 years will be made at that time.  Resume previous diet and medications today.  Return to normal activities tomorrow.  Continue B12 and iron replacement.  Repeat CBC, Ferritin, and B12 levels in 4 weeks.  Office follow up with Dr Henrene Pastor in 4-6 weeks.  Handout given for polyps, and Card for clip placement given.     YOU HAD AN ENDOSCOPIC PROCEDURE TODAY AT [facility]:   Refer to the procedure report that was given to you for any specific questions about what was found during the examination.  If the procedure report does not answer your questions, please call your gastroenterologist to clarify.  If you requested that your care partner not be given the details of your procedure findings, then the procedure report has been included in a sealed envelope for you to review at your convenience later.  YOU SHOULD EXPECT: Some feelings of bloating in the abdomen. Passage of more gas than usual.  Walking can help get rid of the air that was put into your GI tract during the procedure and reduce the bloating. If you had a lower endoscopy (such as a colonoscopy or flexible sigmoidoscopy) you may notice spotting of blood in your stool or on the toilet paper. If you underwent a bowel prep for your procedure, you may not have a normal bowel movement for a few days.  Please Note:  You might notice some irritation and congestion in your nose or some drainage.  This is from the oxygen used during your procedure.  There is no need for concern and it should clear up in a day or so.  SYMPTOMS TO REPORT IMMEDIATELY:   Following lower endoscopy (colonoscopy or flexible sigmoidoscopy):  Excessive amounts of blood in the stool  Significant tenderness or worsening of abdominal pains  Swelling of the abdomen that is new, acute  Fever of 100F or  higher   Following upper endoscopy (EGD)  Vomiting of blood or coffee ground material  New chest pain or pain under the shoulder blades  Painful or persistently difficult swallowing  New shortness of breath  Fever of 100F or higher  Black, tarry-looking stools  For urgent or emergent issues, a gastroenterologist can be reached at any hour by calling [phone].   DIET:  We do recommend a small meal at first, but then you may proceed to your regular diet.  Drink plenty of fluids but you should avoid alcoholic beverages for 24 hours.  ACTIVITY:  You should plan to take it easy for the rest of today and you should NOT DRIVE or use heavy machinery until tomorrow (because of the sedation medicines used during the test).    FOLLOW UP: Our staff will call the number listed on your records the next business day following your procedure to check on you and address any questions or concerns that you may have regarding the information given to you following your procedure. If we do not reach you, we will leave a message.  However, if you are feeling well and you are not experiencing any problems, there is no need to return our call.  We will assume that you have returned to your regular daily activities without incident.  If any biopsies were taken you will be contacted by phone or by letter within the next 1-3 weeks.  Please call us at [phone] if you have not heard about the biopsies in 3 weeks.    SIGNATURES/CONFIDENTIALITY: You and/or your care partner have signed paperwork which will be entered into your electronic medical record.  These signatures attest to the fact that that the information above on your After Visit Summary has been reviewed and is understood.  Full responsibility of the confidentiality of this discharge information lies with you and/or your care-partner.

## 2018-05-08 NOTE — Progress Notes (Signed)
Called to room to assist during endoscopic procedure.  Patient ID and intended procedure confirmed with present staff. Received instructions for my participation in the procedure from the performing physician.  

## 2018-05-09 ENCOUNTER — Telehealth: Payer: Self-pay

## 2018-05-09 NOTE — Telephone Encounter (Signed)
  Follow up Call-  Call back number 05/08/2018  Post procedure Call Back phone  # [phone]  Permission to leave phone message Yes  Some recent data might be hidden     Patient questions:  Do you have a fever, pain , or abdominal swelling? No. Pain Score  0 *  Have you tolerated food without any problems? Yes.    Have you been able to return to your normal activities? Yes.    Do you have any questions about your discharge instructions: Diet   No. Medications  No. Follow up visit  No.  Do you have questions or concerns about your Care? No.  Actions: * If pain score is 4 or above: No action needed, pain <4.

## 2018-05-10 ENCOUNTER — Other Ambulatory Visit: Payer: Self-pay

## 2018-05-10 DIAGNOSIS — D649 Anemia, unspecified: Secondary | ICD-10-CM

## 2018-05-15 ENCOUNTER — Encounter: Payer: Self-pay | Admitting: Internal Medicine

## 2018-07-18 ENCOUNTER — Encounter: Payer: Self-pay | Admitting: General Surgery

## 2018-07-19 ENCOUNTER — Ambulatory Visit (INDEPENDENT_AMBULATORY_CARE_PROVIDER_SITE_OTHER): Payer: No Typology Code available for payment source | Admitting: Internal Medicine

## 2018-07-19 ENCOUNTER — Other Ambulatory Visit: Payer: Self-pay

## 2018-07-19 ENCOUNTER — Encounter: Payer: Self-pay | Admitting: Internal Medicine

## 2018-07-19 VITALS — Ht 64.5 in | Wt 156.0 lb

## 2018-07-19 DIAGNOSIS — K219 Gastro-esophageal reflux disease without esophagitis: Secondary | ICD-10-CM

## 2018-07-19 DIAGNOSIS — E538 Deficiency of other specified B group vitamins: Secondary | ICD-10-CM

## 2018-07-19 DIAGNOSIS — K3189 Other diseases of stomach and duodenum: Secondary | ICD-10-CM | POA: Diagnosis not present

## 2018-07-19 DIAGNOSIS — D500 Iron deficiency anemia secondary to blood loss (chronic): Secondary | ICD-10-CM

## 2018-07-19 NOTE — Patient Instructions (Signed)
If you are age 52 or older, your body mass index should be between 23-30. Your Body mass index is 26.36 kg/m. If this is out of the aforementioned range listed, please consider follow up with your Primary Care Provider.  If you are age 62 or younger, your body mass index should be between 19-25. Your Body mass index is 26.36 kg/m. If this is out of the aformentioned range listed, please consider follow up with your Primary Care Provider.   Continue B12 and iron replacement therapy with PCP.  You will be placed on recall for EGD in one year.  You will be placed on recall for surveillance colonoscopy in seven years.  Thank you for choosing me and [facility].   Scarlette Shorts, MD

## 2018-07-19 NOTE — Progress Notes (Signed)
HISTORY OF PRESENT ILLNESS:  Kelli Brandt is a 53 y.o. female who was evaluated by the GI nurse practitioner April 19, 2018 regarding iron deficiency and B12 anemia's.  She subsequently underwent colonoscopy and upper endoscopy May 08, 2018.  Colonoscopy revealed a normal terminal ileum, 3 mm transverse colon polyp which was an adenoma (removed), melanosis, and hemorrhoids.  Follow-up colonoscopy in 7 years recommended.  Upper endoscopy revealed a 5 mm raised nodule with central ulceration which was biopsied.  Post biopsy bleeding was clipped.  The pathology revealed neuroendocrine type tumor.  CT scan of the abdomen and pelvis 2 months earlier (March 12, 2018) was unremarkable.  In particular no gastric abnormalities or adenopathy.  Duodenal biopsies were unremarkable.  No evidence for celiac disease.  The patient has since been on B12 and iron replacement therapies.  She tells me that her hemoglobin, B12, and iron levels have improved (checked by her primary care provider Dr. Leonides Schanz).  She tells me that she is feeling great.  No GI complaints.  No other complaints.  REVIEW OF SYSTEMS:  All non-GI ROS negative unless otherwise stated in the HPI except for seasonal allergies  Past Medical History:  Diagnosis Date  . Allergy   . Anemia   . DDD (degenerative disc disease), cervical   . Diabetes mellitus without complication ([place])   . Hyperlipidemia   . Hypertension   . Hyperthyroidism   . Kidney stones     Past Surgical History:  Procedure Laterality Date  . BACK SURGERY  1998  . CHOLECYSTECTOMY    . COLONOSCOPY    . DILATION AND CURETTAGE OF UTERUS     Novasure  . KIDNEY STONE SURGERY    . LITHOTRIPSY     x 2   . URETHRAL DILATION      Social History YU CRAGUN  reports that she has never smoked. She has never used smokeless tobacco. She reports that she does not drink alcohol or use drugs.  family history includes Alzheimer's disease in her mother; Breast cancer in  her mother.  No Known Allergies     PHYSICAL EXAMINATION: Patient looks well.  Alert and oriented.  Cooperative No additional physical exam information with telehealth visit   ASSESSMENT:  1.  Iron deficiency anemia.  Colonoscopy and EGD as described.  Improvement on replacement therapy 2.  B12 deficiency.  Improvement on replacement therapy 3.  Small gastric neuroendocrine tumor biopsied. 4.  Colonic adenoma   PLAN:  1.  Continue B12 and iron replacement therapies with PCP 2.  SCHEDULE RECALL EGD IN 1 YEAR.  This to reevaluate diminutive gastric nodule (neuroendocrine tumor).  If area identifiable would consider EUS and possible resection 3.  Surveillance colonoscopy 7 years This [employer]Ex audiovisual telehealth visit was initiated by the patient and consented for by the patient.  She was in her home and I was in my office during the encounter.  She understands her may be professional charge associated with this service

## 2021-02-25 ENCOUNTER — Ambulatory Visit
Admission: RE | Admit: 2021-02-25 | Discharge: 2021-02-25 | Disposition: A | Discharge status: Home / Self Care | Payer: 59 | Source: Ambulatory Visit | Attending: Family Medicine | Admitting: Family Medicine

## 2021-02-25 ENCOUNTER — Other Ambulatory Visit: Payer: Self-pay | Admitting: Family Medicine

## 2021-02-25 ENCOUNTER — Other Ambulatory Visit: Payer: Self-pay

## 2021-02-25 DIAGNOSIS — J209 Acute bronchitis, unspecified: Secondary | ICD-10-CM

## 2021-02-25 DIAGNOSIS — J069 Acute upper respiratory infection, unspecified: Secondary | ICD-10-CM

## 2021-11-18 ENCOUNTER — Other Ambulatory Visit (HOSPITAL_BASED_OUTPATIENT_CLINIC_OR_DEPARTMENT_OTHER): Payer: Self-pay | Admitting: Family Medicine

## 2021-11-18 DIAGNOSIS — R748 Abnormal levels of other serum enzymes: Secondary | ICD-10-CM

## 2021-11-30 ENCOUNTER — Ambulatory Visit (HOSPITAL_BASED_OUTPATIENT_CLINIC_OR_DEPARTMENT_OTHER)
Admission: RE | Admit: 2021-11-30 | Discharge: 2021-11-30 | Disposition: A | Discharge status: Home / Self Care | Payer: 59 | Source: Ambulatory Visit | Attending: Family Medicine | Admitting: Family Medicine

## 2021-11-30 DIAGNOSIS — R748 Abnormal levels of other serum enzymes: Secondary | ICD-10-CM | POA: Insufficient documentation

## 2023-03-17 IMAGING — CR DG CHEST 2V
2 series · 2 of 2 positions shown · non-contrast
Comparison: 09/05/2007

CLINICAL DATA: Upper respiratory tract infection. Acute bronchitis.
Cough for 4 days.

EXAM:
CHEST - 2 VIEW

[w chest pa]
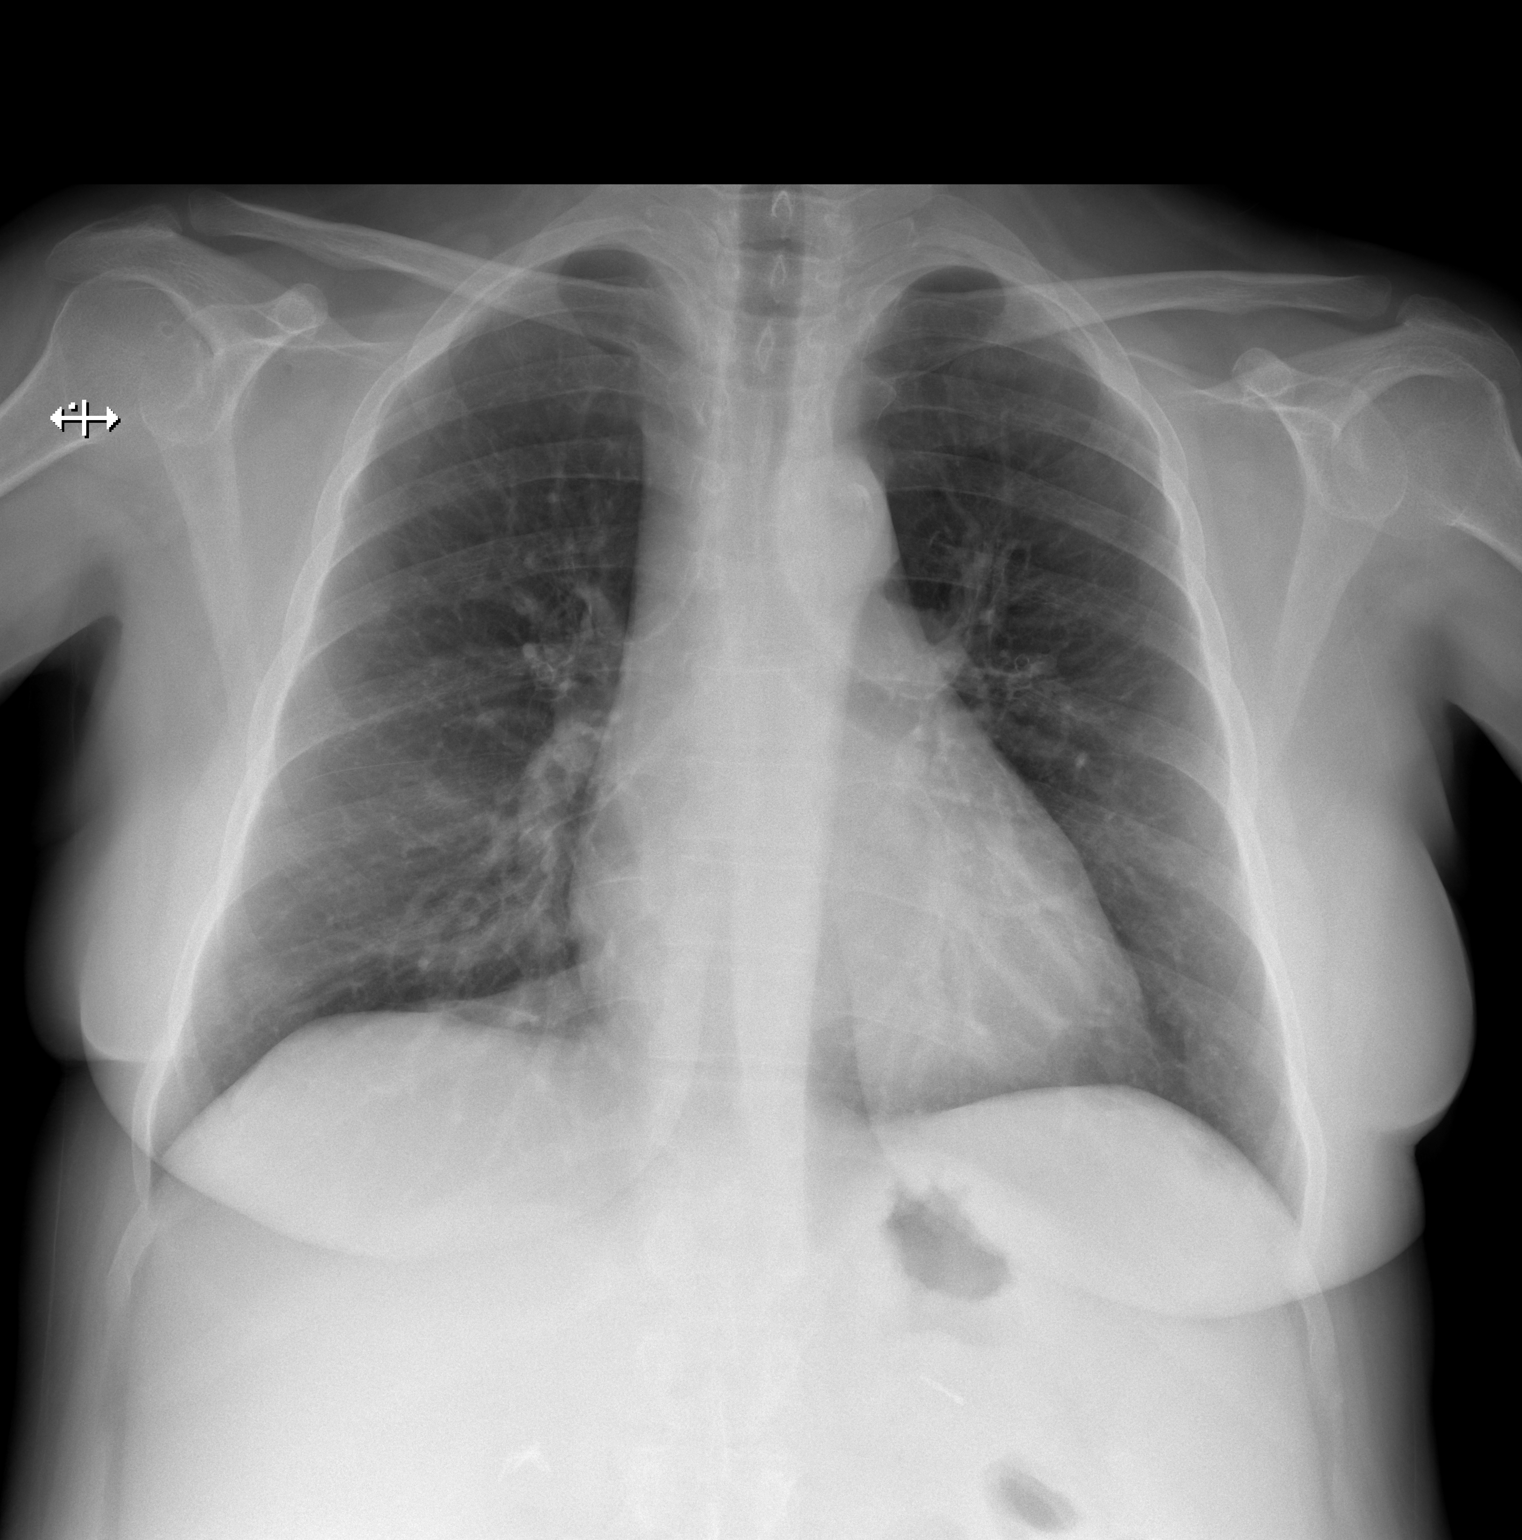

[w chest lat]
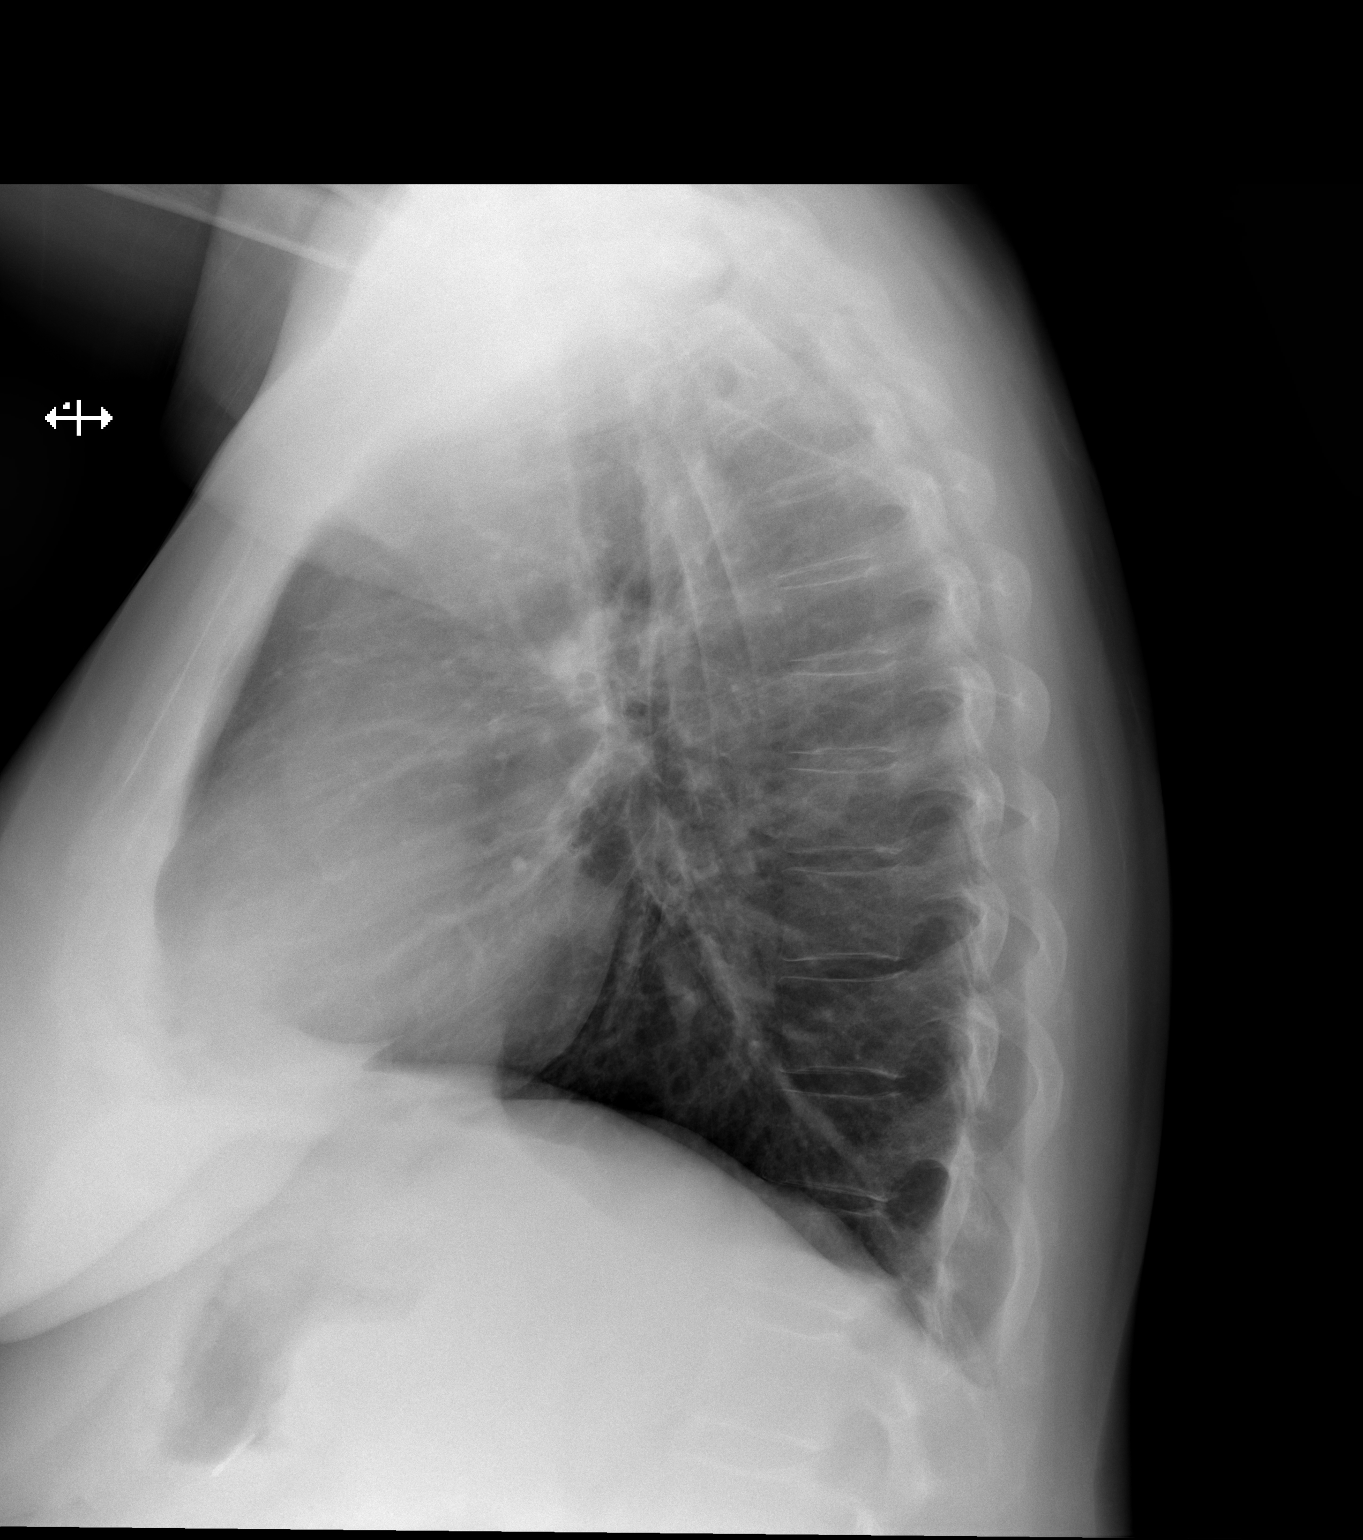

[2 of 2 positions shown; findings below may reference images not displayed]

FINDINGS: The cardiac silhouette is upper limits of normal in size. The lungs
are well inflated. No airspace consolidation, edema, pleural
effusion, or pneumothorax is identified. Upper abdominal surgical
clips are noted. No acute osseous abnormality is seen.
IMPRESSION: No active cardiopulmonary disease.

## 2023-04-05 ENCOUNTER — Ambulatory Visit: Payer: 59 | Admitting: Internal Medicine

## 2023-04-05 ENCOUNTER — Encounter: Payer: Self-pay | Admitting: Internal Medicine

## 2023-04-05 ENCOUNTER — Other Ambulatory Visit (INDEPENDENT_AMBULATORY_CARE_PROVIDER_SITE_OTHER): Payer: 59

## 2023-04-05 VITALS — BP 102/70 | HR 90 | Ht 64.5 in | Wt 156.0 lb

## 2023-04-05 DIAGNOSIS — Z862 Personal history of diseases of the blood and blood-forming organs and certain disorders involving the immune mechanism: Secondary | ICD-10-CM

## 2023-04-05 DIAGNOSIS — D3A092 Benign carcinoid tumor of the stomach: Secondary | ICD-10-CM | POA: Diagnosis not present

## 2023-04-05 DIAGNOSIS — R7989 Other specified abnormal findings of blood chemistry: Secondary | ICD-10-CM

## 2023-04-05 DIAGNOSIS — D369 Benign neoplasm, unspecified site: Secondary | ICD-10-CM

## 2023-04-05 DIAGNOSIS — D509 Iron deficiency anemia, unspecified: Secondary | ICD-10-CM

## 2023-04-05 DIAGNOSIS — E538 Deficiency of other specified B group vitamins: Secondary | ICD-10-CM | POA: Diagnosis not present

## 2023-04-05 DIAGNOSIS — Z860101 Personal history of adenomatous and serrated colon polyps: Secondary | ICD-10-CM

## 2023-04-05 LAB — PROTIME-INR
INR: 1.2 {ratio} — ABNORMAL HIGH (ref 0.8–1.0)
Prothrombin Time: 13.1 s (ref 9.6–13.1)

## 2023-04-05 LAB — HEPATIC FUNCTION PANEL
ALT: 46 U/L — ABNORMAL HIGH (ref 0–35)
AST: 46 U/L — ABNORMAL HIGH (ref 0–37)
Albumin: 4.9 g/dL (ref 3.5–5.2)
Alkaline Phosphatase: 129 U/L — ABNORMAL HIGH (ref 39–117)
Bilirubin, Direct: 0.2 mg/dL (ref 0.0–0.3)
Total Bilirubin: 0.7 mg/dL (ref 0.2–1.2)
Total Protein: 8.2 g/dL (ref 6.0–8.3)

## 2023-04-05 LAB — IRON: Iron: 102 ug/dL (ref 42–145)

## 2023-04-05 LAB — LIPID PANEL
Cholesterol: 113 mg/dL (ref 0–200)
HDL: 33.6 mg/dL — ABNORMAL LOW (ref >39.00)
LDL Cholesterol: 38 mg/dL (ref 0–99)
NonHDL: 79.77
Total CHOL/HDL Ratio: 3
Triglycerides: 209 mg/dL — ABNORMAL HIGH (ref 0.0–149.0)
VLDL: 41.8 mg/dL — ABNORMAL HIGH (ref 0.0–40.0)

## 2023-04-05 LAB — IBC + FERRITIN
Ferritin: 30.1 ng/mL (ref 10.0–291.0)
Iron: 102 ug/dL (ref 42–145)
Saturation Ratios: 20.9 % (ref 20.0–50.0)
TIBC: 488.6 ug/dL — ABNORMAL HIGH (ref 250.0–450.0)
Transferrin: 349 mg/dL (ref 212.0–360.0)

## 2023-04-05 NOTE — Progress Notes (Signed)
HISTORY OF PRESENT ILLNESS:  Kelli Brandt is a 58 y.o. female with multiple medical problems as listed below who is sent today by her primary care provider regarding persistent mild abnormalities of liver tests.  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  She was last assessed by this office virtually Jul 19, 2018 after being evaluated by the GI nurse practitioner April 19, 2018 regarding iron deficiency and B12 anemia's. She subsequently underwent colonoscopy and upper endoscopy May 08, 2018. Colonoscopy revealed a normal terminal ileum, 3 mm transverse colon polyp which was an adenoma (removed), melanosis, and hemorrhoids. Follow-up colonoscopy in 7 years recommended. Upper endoscopy revealed a 5 mm raised nodule with central ulceration which was biopsied. Post biopsy bleeding was clipped. The pathology revealed neuroendocrine type tumor. CT scan of the abdomen and pelvis 2 months earlier (March 12, 2018) was unremarkable. In particular no gastric abnormalities or adenopathy. Duodenal biopsies were unremarkable. No evidence for celiac disease.  At the time of her last visit, the patient has since been on B12 and iron replacement therapies. She told me that her hemoglobin, B12, and iron levels have improved (checked by her primary care provider Dr. Uvaldo Rising).  At that time she was feeling great. No GI complaints. No other complaints.  Plans at that time were to continue replacement therapies with her PCP, recall EGD in 1 year, and surveillance colonoscopy in 7 years.  The patient did receive a recall letter for upper endoscopy, but did not respond.  She has not been seen since.  Reviewing outside records shows mild elevation of hepatic transaminases and alkaline phosphatase.  Blood work from November 24, 2022 shows ALT 50, AST 46, alkaline phosphatase 139, and total bilirubin 0.8.  Normal protein and  albumin.  Normal CBC at that time with hemoglobin 13.3.  Normal MCV.  Borderline platelets of 146.  She did undergo abdominal ultrasound September 2023.  She was noted to have changes consistent with fatty liver.  Also splenomegaly.  She is status post cholecystectomy.  [name]  No family history of liver disease.  She has been on Ozempic for about 6 weeks and has lost 10 pounds.  Also improvement in her hemoglobin A1c, now 6.4.  For her psoriasis she was switched from long-term Humira to [place] over the past 4 months or so.  Other medications stable.  GI review of systems is negative except for bloating and belching  REVIEW OF SYSTEMS:  All non-GI ROS negative unless otherwise stated in the HPI except for allergies, arthritis, back pain,  Past Medical History:  Diagnosis Date   Allergy    Anemia    DDD (degenerative disc disease), cervical    Diabetes mellitus without complication (H[facility])    Hyperlipidemia    Hypertension    Hyperthyroidism    Kidney stones     Past Surgical History:  Procedure Laterality Date   BACK SURGERY  1998   CHOLECYSTECTOMY     COLONOSCOPY     DILATION AND CURETTAGE OF UTERUS     Novasure   KIDNEY STONE SURGERY     LITHOTRIPSY     x 2    URETHRAL DILATION      Social History Kelli Brandt  reports that she has never smoked. She has never used smokeless tobacco. She reports that she does not drink alcohol and does not use drugs.  family history includes Alzheimer's disease in her mother; Breast cancer in her mother.  No Known Allergies     PHYSICAL EXAMINATION: Vital signs: BP 102/70   Pulse 90   Ht 5' 4.5" (1.638 m)   Wt 156 lb (70.8 kg)   BMI 26.36 kg/m   Constitutional: generally well-appearing, no acute distress Psychiatric: alert and oriented x3, cooperative Eyes: extraocular movements intact, anicteric, conjunctiva pink Mouth: oral pharynx moist, no lesions Neck: supple no  lymphadenopathy Cardiovascular: heart regular rate and rhythm, no murmur Lungs: clear to auscultation bilaterally Abdomen: soft, nontender, nondistended, no obvious ascites, no peritoneal signs, normal bowel sounds, no organomegaly Rectal: Omitted Extremities: no clubbing, cyanosis, or lower extremity edema bilaterally Skin: no lesions on visible extremities Neuro: No focal deficits. No asterixis.    ASSESSMENT:   1.  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  Rule out other causes. 2.  History of iron deficiency anemia.  Colonoscopy and EGD as described.  Improvement on replacement therapy 3.  B12 deficiency.  Improvement on replacement therapy 4.  Small gastric neuroendocrine tumor biopsied. 5.  History of colonic adenoma 2020     PLAN:   1.  Multiple laboratory studies to evaluate for various causes of chronic elevation of liver tests 2.  Continue B12 and iron replacement therapies with PCP 3.  Follow-up EGD.  The patient is due.  This to reevaluate diminutive gastric nodule (neuroendocrine tumor).  If area identifiable would consider EUS and possible resection.  We will arrange after the above. 4.  Surveillance colonoscopy 7 years 5.  Routine office follow-up regarding liver in 6 months Total time of 60 minutes was spent preparing to see the patient, obtaining comprehensive history, performing medically appropriate physical exam, counseling and educating the patient regarding the above listed issues, ordering multiple laboratory studies, ordering endoscopic procedure, and documenting clinical information in the health record

## 2023-04-05 NOTE — Patient Instructions (Signed)
Your provider has requested that you go to the basement level for lab work before leaving today. Press "B" on the elevator. The lab is located at the first door on the left as you exit the elevator.  Please follow up in 6 months.  _______________________________________________________  If your blood pressure at your visit was 140/90 or greater, please contact your primary care physician to follow up on this.  _______________________________________________________  If you are age 71 or older, your body mass index should be between 23-30. Your Body mass index is 26.36 kg/m. If this is out of the aforementioned range listed, please consider follow up with your Primary Care Provider.  If you are age 54 or younger, your body mass index should be between 19-25. Your Body mass index is 26.36 kg/m. If this is out of the aformentioned range listed, please consider follow up with your Primary Care Provider.   ________________________________________________________  The [facility] providers would like to encourage you to use [facility] to communicate with providers for non-urgent requests or questions.  Due to long hold times on the telephone, sending your provider a message by [facility] may be a faster and more efficient way to get a response.  Please allow 48 business hours for a response.  Please remember that this is for non-urgent requests.  _______________________________________________________

## 2023-04-07 LAB — CERULOPLASMIN: Ceruloplasmin: 31 mg/dL (ref 14–48)

## 2023-04-07 LAB — HEPATITIS B SURFACE ANTIGEN: Hepatitis B Surface Ag: NONREACTIVE

## 2023-04-07 LAB — HEPATITIS C ANTIBODY: Hepatitis C Ab: NONREACTIVE

## 2023-04-07 LAB — HEPATITIS B SURFACE ANTIBODY,QUALITATIVE: Hep B S Ab: NONREACTIVE

## 2023-04-07 LAB — ANA: Anti Nuclear Antibody (ANA): NEGATIVE

## 2023-04-07 LAB — TISSUE TRANSGLUTAMINASE, IGA: (tTG) Ab, IgA: <1 U/mL

## 2023-04-07 LAB — MITOCHONDRIAL ANTIBODIES: Mitochondrial M2 Ab, IgG: <=20 U

## 2023-04-07 LAB — ALPHA-1-ANTITRYPSIN: A-1 Antitrypsin, Ser: 168 mg/dL (ref 83–199)

## 2023-04-07 LAB — ANTI-SMOOTH MUSCLE ANTIBODY, IGG: Actin (Smooth Muscle) Antibody (IGG): <20 U

## 2023-04-08 ENCOUNTER — Encounter: Payer: Self-pay | Admitting: Internal Medicine

## 2023-04-10 ENCOUNTER — Encounter: Payer: Self-pay | Admitting: *Deleted

## 2023-04-10 ENCOUNTER — Other Ambulatory Visit: Payer: Self-pay | Admitting: *Deleted

## 2023-04-10 DIAGNOSIS — D3A092 Benign carcinoid tumor of the stomach: Secondary | ICD-10-CM

## 2023-04-21 ENCOUNTER — Other Ambulatory Visit: Payer: Self-pay | Admitting: Urology

## 2023-05-19 ENCOUNTER — Ambulatory Visit: Payer: 59 | Admitting: Internal Medicine

## 2023-05-19 ENCOUNTER — Encounter: Payer: Self-pay | Admitting: Internal Medicine

## 2023-05-19 ENCOUNTER — Encounter: Payer: 59 | Admitting: Internal Medicine

## 2023-05-19 ENCOUNTER — Telehealth: Payer: Self-pay | Admitting: Gastroenterology

## 2023-05-19 VITALS — BP 131/81 | HR 81 | Temp 98.1°F | Resp 9 | Ht 64.0 in | Wt 156.0 lb

## 2023-05-19 DIAGNOSIS — D3A092 Benign carcinoid tumor of the stomach: Secondary | ICD-10-CM

## 2023-05-19 DIAGNOSIS — K449 Diaphragmatic hernia without obstruction or gangrene: Secondary | ICD-10-CM

## 2023-05-19 DIAGNOSIS — T182XXA Foreign body in stomach, initial encounter: Secondary | ICD-10-CM

## 2023-05-19 DIAGNOSIS — K219 Gastro-esophageal reflux disease without esophagitis: Secondary | ICD-10-CM | POA: Diagnosis not present

## 2023-05-19 MED ORDER — SODIUM CHLORIDE 0.9 % IV SOLN: 500.0000 mL | Freq: Once | INTRAVENOUS | Status: AC

## 2023-05-19 NOTE — Progress Notes (Signed)
 Sedate, gd SR, tolerated procedure well, VSS, report to RN

## 2023-05-19 NOTE — Telephone Encounter (Signed)
 Called by patient this evening. She had an EGD by Dr. Marina Goodell earlier. She has had now 2 episodes of pink and red urine occur. She has a history of non-obstructive kidney stones per report with plan for interventions in the next few weeks. She had some mild urinary urgency but without dysuria or any sort of abdominal or back pain. She wonders if this is a result of the EGD earlier today. I told her unlikely. More liekly from her know Nephrolithiasis. I told her to hydrate significantly tonight. If she starts having persistent red urine, then she needs to come to the emergency department overnight into the weekend. If the urine lightens but still is blood tinged, she needs to go to ED or UC over weekend. If any progressive abdominal pain/back pain she needs to go to the ED. She agrees with this plan. I'll forward to Dr. Marina Goodell and his team next week for review. She agreed with this plan.   Kelli Parish, MD [facility] Advanced Endoscopy Office # [phone]

## 2023-05-19 NOTE — Patient Instructions (Signed)

## 2023-05-19 NOTE — Progress Notes (Signed)
 Expand All Collapse All HISTORY OF PRESENT ILLNESS:   Kelli Brandt is a 58 y.o. female with multiple medical problems as listed below who is sent today by her primary care provider regarding persistent mild abnormalities of liver tests.  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   She was last assessed by this office virtually Jul 19, 2018 after being evaluated by the GI nurse practitioner April 19, 2018 regarding iron deficiency and B12 anemia's. She subsequently underwent colonoscopy and upper endoscopy May 08, 2018. Colonoscopy revealed a normal terminal ileum, 3 mm transverse colon polyp which was an adenoma (removed), melanosis, and hemorrhoids. Follow-up colonoscopy in 7 years recommended. Upper endoscopy revealed a 5 mm raised nodule with central ulceration which was biopsied. Post biopsy bleeding was clipped. The pathology revealed neuroendocrine type tumor. CT scan of the abdomen and pelvis 2 months earlier (March 12, 2018) was unremarkable. In particular no gastric abnormalities or adenopathy. Duodenal biopsies were unremarkable. No evidence for celiac disease.  At the time of her last visit, the patient has since been on B12 and iron replacement therapies. She told me that her hemoglobin, B12, and iron levels have improved (checked by her primary care provider Kelli Brandt).  At that time she was feeling great. No GI complaints. No other complaints.  Plans at that time were to continue replacement therapies with her PCP, recall EGD in 1 year, and surveillance colonoscopy in 7 years.  The patient did receive a recall letter for upper endoscopy, but did not respond.  She has not been seen since.   Reviewing outside records shows mild elevation of hepatic transaminases and alkaline phosphatase.  Blood work from November 24, 2022 shows ALT 50, AST 46, alkaline phosphatase 139, and total bilirubin  0.8.  Normal protein and albumin.  Normal CBC at that time with hemoglobin 13.3.  Normal MCV.  Borderline platelets of 146.  She did undergo abdominal ultrasound September 2023.  She was noted to have changes consistent with fatty liver.  Also splenomegaly.  She is status post cholecystectomy.   [name]  No family history of liver disease.  She has been on Ozempic for about 6 weeks and has lost 10 pounds.  Also improvement in her hemoglobin A1c, now 6.4.  For her psoriasis she was switched from long-term Humira to [place] over the past 4 months or so.  Other medications stable.  GI review of systems is negative except for bloating and belching   REVIEW OF SYSTEMS:   All non-GI ROS negative unless otherwise stated in the HPI except for allergies, arthritis, back pain,       Past Medical History:  Diagnosis Date   Allergy     Anemia     DDD (degenerative disc disease), cervical     Diabetes mellitus without complication (HCC)     Hyperlipidemia     Hypertension     Hyperthyroidism     Kidney stones                 Past Surgical History:  Procedure Laterality Date   BACK SURGERY   1998   CHOLECYSTECTOMY       COLONOSCOPY       DILATION AND CURETTAGE OF UTERUS        Novasure   KIDNEY STONE SURGERY       LITHOTRIPSY  x 2    URETHRAL DILATION              Social History Kelli Brandt  reports that she has never smoked. She has never used smokeless tobacco. She reports that she does not drink alcohol and does not use drugs.   family history includes Alzheimer's disease in her mother; Breast cancer in her mother.   Allergies  No Known Allergies         PHYSICAL EXAMINATION: Vital signs: BP 102/70   Pulse 90   Ht 5' 4.5" (1.638 m)   Wt 156 lb (70.8 kg)   BMI 26.36 kg/m   Constitutional: generally well-appearing, no acute distress Psychiatric: alert and oriented x3, cooperative Eyes: extraocular movements intact,  anicteric, conjunctiva pink Mouth: oral pharynx moist, no lesions Neck: supple no lymphadenopathy Cardiovascular: heart regular rate and rhythm, no murmur Lungs: clear to auscultation bilaterally Abdomen: soft, nontender, nondistended, no obvious ascites, no peritoneal signs, normal bowel sounds, no organomegaly Rectal: Omitted Extremities: no clubbing, cyanosis, or lower extremity edema bilaterally Skin: no lesions on visible extremities Neuro: No focal deficits. No asterixis.       ASSESSMENT:   1.  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  Rule out other causes. 2.  History of iron deficiency anemia.  Colonoscopy and EGD as described.  Improvement on replacement therapy 3.  B12 deficiency.  Improvement on replacement therapy 4.  Small gastric neuroendocrine tumor biopsied. 5.  History of colonic adenoma 2020     PLAN:   1.  Multiple laboratory studies to evaluate for various causes of chronic elevation of liver tests 2.  Continue B12 and iron replacement therapies with PCP 3.  Follow-up EGD.  The patient is due.  This to reevaluate diminutive gastric nodule (neuroendocrine tumor).  If area identifiable would consider EUS and possible resection.  We will arrange after the above. 4.  Surveillance colonoscopy 7 years 5.  Routine office follow-up regarding liver in 6 months

## 2023-05-19 NOTE — Progress Notes (Signed)
 Pt's states no medical or surgical changes since previsit or office visit.

## 2023-05-19 NOTE — Op Note (Signed)
 [facility] Patient Name: Kelli Brandt Procedure Date: 05/19/2023 10:24 AM MRN: [id number] Endoscopist: Wilhemina Bonito. Marina Goodell , MD, [id number] Age: 58 Referring MD:  Date of Birth: November 17, 1965 Gender: Female Account #: [id number] Procedure:                Upper GI endoscopy Indications:              Esophageal reflux, history of diminutive carcinoid                            of the stomach. Follow-up Medicines:                Monitored Anesthesia Care Procedure:                Pre-Anesthesia Assessment:                           - Prior to the procedure, a History and Physical                            was performed, and patient medications and                            allergies were reviewed. The patient's tolerance of                            previous anesthesia was also reviewed. The risks                            and benefits of the procedure and the sedation                            options and risks were discussed with the patient.                            All questions were answered, and informed consent                            was obtained. Prior Anticoagulants: The patient has                            taken no anticoagulant or antiplatelet agents. ASA                            Grade Assessment: II - A patient with mild systemic                            disease. After reviewing the risks and benefits,                            the patient was deemed in satisfactory condition to                            undergo the procedure.  After obtaining informed consent, the endoscope was                            passed under direct vision. Throughout the                            procedure, the patient's blood pressure, pulse, and                            oxygen saturations were monitored continuously. The                            GIF [id number] #[id number] was introduced through the                            mouth, and advanced to the  second part of duodenum.                            The upper GI endoscopy was accomplished without                            difficulty. The patient tolerated the procedure                            well. Scope In: Scope Out: Findings:                 The esophagus was normal.                           The stomach revealed small hiatal hernia. The                            previously placed clip at the biopsy site of                            diminutive carcinoid still in place. No pathology                            otherwise identified..                           The examined duodenum was normal.                           The cardia and gastric fundus were normal on                            retroflexion. Complications:            No immediate complications. Estimated Blood Loss:     Estimated blood loss: none. Impression:               1. Prior clip at diminutive carcinoid noted. No                            evidence of  residual tumor.                           2. Otherwise unremarkable EGD                           3. GERD. Recommendation:           - Patient has a contact number available for                            emergencies. The signs and symptoms of potential                            delayed complications were discussed with the                            patient. Return to normal activities tomorrow.                            Written discharge instructions were provided to the                            patient.                           - Resume previous diet.                           - Continue present medications.                           -No routine follow-up EGD required. Wilhemina Bonito. Marina Goodell, MD 05/19/2023 10:49:09 AM This report has been signed electronically.

## 2023-05-20 NOTE — Telephone Encounter (Signed)
 Agree. Not related to EGD. Thanks

## 2023-05-22 ENCOUNTER — Telehealth: Payer: Self-pay

## 2023-05-22 NOTE — Telephone Encounter (Signed)
  Follow up Call-     05/19/2023    9:39 AM  Call back number  Post procedure Call Back phone  # [phone]  Permission to leave phone message Yes     Patient questions:  Do you have a fever, pain , or abdominal swelling? No. Pain Score  0 *  Have you tolerated food without any problems? Yes.    Have you been able to return to your normal activities? Yes.    Do you have any questions about your discharge instructions: Diet   No. Medications  No. Follow up visit  No.  Do you have questions or concerns about your Care? No.  Actions: * If pain score is 4 or above: No action needed, pain <4.

## 2023-06-06 ENCOUNTER — Other Ambulatory Visit (HOSPITAL_BASED_OUTPATIENT_CLINIC_OR_DEPARTMENT_OTHER): Payer: Self-pay | Admitting: Family Medicine

## 2023-06-12 ENCOUNTER — Ambulatory Visit (HOSPITAL_BASED_OUTPATIENT_CLINIC_OR_DEPARTMENT_OTHER)
Admission: RE | Admit: 2023-06-12 | Discharge: 2023-06-12 | Disposition: A | Discharge status: Home / Self Care | Source: Ambulatory Visit | Attending: Family Medicine | Admitting: Family Medicine

## 2023-06-16 NOTE — Progress Notes (Addendum)
 Anesthesia Review:  PCP: Gweneth Dimitri  Endocrinologist- Alesia Richards  Cardiologist : none   PPM/ ICD: Device Orders: Rep Notified:  Chest x-ray : EKG : 06/20/23  Echo : Stress test: Cardiac Cath :   Activity level: can do a flight of stairs without difficutly  Sleep Study/ CPAP : none  Fasting Blood Sugar :      / Checks Blood Sugar -- times a day:     DM- type 2 Hgba1c-  06/20/23- 6.3  Synjardy- Hold for 72 hours prior to procedure- last dose on 06/25/2023  Lantus-  Take 1/2 dose nite before and 1/2 dose am of surgery  Ozempic last dose on 06/20/2023  Blood Thinner/ Instructions /Last Dose: ASA / Instructions/ Last Dose :    81 mg aspirin    AT preop pt concerned about glucose getting " too high" being off Synjardy and Ozempic.  Instructed pt to notify Alesia Richards of surgery date and time and length of surgery and what preop instructions she has been given and follow her recommendaitons.  PT voiced understanding.     PT positive for covid approx 04/26/2023.  Did home test then had video visit with MD and was placed on Paxlovid.  PT fine now.

## 2023-06-16 NOTE — Patient Instructions (Addendum)
 SURGICAL WAITING ROOM VISITATION  Patients having surgery or a procedure may have no more than 2 support people in the waiting area - these visitors may rotate.    Children under the age of 74 must have an adult with them who is not the patient.  Due to an increase in RSV and influenza rates and associated hospitalizations, children ages 76 and under may not visit patients in [facility] hospitals.  Visitors with respiratory illnesses are discouraged from visiting and should remain at home.  If the patient needs to stay at the hospital during part of their recovery, the visitor guidelines for inpatient rooms apply. Pre-op nurse will coordinate an appropriate time for 1 support person to accompany patient in pre-op.  This support person may not rotate.    Please refer to the [facility] website for the visitor guidelines for Inpatients (after your surgery is over and you are in a regular room).       Your procedure is scheduled on:  06/29/2023    Report to [facility] Main Entrance    Report to admitting at  0630 AM   Call this number if you have problems the morning of surgery [phone]   Do not eat food   or drink liquids :After Midnight.              If you have questions, please contact your surgeon's office.      Oral Hygiene is also important to reduce your risk of infection.                                    Remember - BRUSH YOUR TEETH THE MORNING OF SURGERY WITH YOUR REGULAR TOOTHPASTE  DENTURES WILL BE REMOVED PRIOR TO SURGERY PLEASE DO NOT APPLY "Poly grip" OR ADHESIVES!!!   Do NOT smoke after Midnight   Stop all vitamins and herbal supplements 7 days before surgery.   Take these medicines the morning of surgery with A SIP OF WATER:  allopurinol, nexium allegra, synthroid, flonase                Synjardy-  Hold for 72 hours prior to procedure.  Last dose on 06/25/23             Lantus Take 1/2 dose nite before surgery and 1/2 dose am of surgery             Ozempic  Hold for 7 days prior to surgery.  Last dose on 06/22/2023.   DO NOT TAKE ANY ORAL DIABETIC MEDICATIONS DAY OF YOUR SURGERY  Bring CPAP mask and tubing day of surgery.                              You may not have any metal on your body including hair pins, jewelry, and body piercing             Do not wear make-up, lotions, powders, perfumes/cologne, or deodorant  Do not wear nail polish including gel and S&S, artificial/acrylic nails, or any other type of covering on natural nails including finger and toenails. If you have artificial nails, gel coating, etc. that needs to be removed by a nail salon please have this removed prior to surgery or surgery may need to be canceled/ delayed if the surgeon/ anesthesia feels like they are unable to be safely monitored.  Do not shave  48 hours prior to surgery.               Men may shave face and neck.   Do not bring valuables to the hospital. [facility] IS NOT             RESPONSIBLE   FOR VALUABLES.   Contacts, glasses, dentures or bridgework may not be worn into surgery.   Bring small overnight bag day of surgery.   DO NOT BRING YOUR HOME MEDICATIONS TO THE HOSPITAL. PHARMACY WILL DISPENSE MEDICATIONS LISTED ON YOUR MEDICATION LIST TO YOU DURING YOUR ADMISSION IN THE HOSPITAL!    Patients discharged on the day of surgery will not be allowed to drive home.  Someone NEEDS to stay with you for the first 24 hours after anesthesia.   Special Instructions: Bring a copy of your healthcare power of attorney and living will documents the day of surgery if you haven't scanned them before.              Please read over the following fact sheets you were given: IF YOU HAVE QUESTIONS ABOUT YOUR PRE-OP INSTRUCTIONS PLEASE CALL [phone]   If you received a COVID test during your pre-op visit  it is requested that you wear a mask when out in public, stay away from anyone that may not be feeling well and notify your surgeon if you  develop symptoms. If you test positive for Covid or have been in contact with anyone that has tested positive in the last 10 days please notify you surgeon.    [facility] - Preparing for Surgery Before surgery, you can play an important role.  Because skin is not sterile, your skin needs to be as free of germs as possible.  You can reduce the number of germs on your skin by washing with CHG (chlorahexidine gluconate) soap before surgery.  CHG is an antiseptic cleaner which kills germs and bonds with the skin to continue killing germs even after washing. Please DO NOT use if you have an allergy to CHG or antibacterial soaps.  If your skin becomes reddened/irritated stop using the CHG and inform your nurse when you arrive at Short Stay. Do not shave (including legs and underarms) for at least 48 hours prior to the first CHG shower.  You may shave your face/neck. Please follow these instructions carefully:  1.  Shower with CHG Soap the night before surgery and the  morning of Surgery.  2.  If you choose to wash your hair, wash your hair first as usual with your  normal  shampoo.  3.  After you shampoo, rinse your hair and body thoroughly to remove the  shampoo.                           4.  Use CHG as you would any other liquid soap.  You can apply chg directly  to the skin and wash                       Gently with a scrungie or clean washcloth.  5.  Apply the CHG Soap to your body ONLY FROM THE NECK DOWN.   Do not use on face/ open                           Wound or open sores. Avoid contact with eyes,  ears mouth and genitals (private parts).                       Wash face,  Genitals (private parts) with your normal soap.             6.  Wash thoroughly, paying special attention to the area where your surgery  will be performed.  7.  Thoroughly rinse your body with warm water from the neck down.  8.  DO NOT shower/wash with your normal soap after using and rinsing off  the CHG Soap.                 9.  Pat yourself dry with a clean towel.            10.  Wear clean pajamas.            11.  Place clean sheets on your bed the night of your first shower and do not  sleep with pets. Day of Surgery : Do not apply any lotions/deodorants the morning of surgery.  Please wear clean clothes to the hospital/surgery center.  FAILURE TO FOLLOW THESE INSTRUCTIONS MAY RESULT IN THE CANCELLATION OF YOUR SURGERY PATIENT SIGNATURE_________________________________  NURSE SIGNATURE__________________________________  ________________________________________________________________________

## 2023-06-19 ENCOUNTER — Encounter: Payer: 59 | Admitting: Internal Medicine

## 2023-06-20 ENCOUNTER — Other Ambulatory Visit: Payer: Self-pay

## 2023-06-20 ENCOUNTER — Encounter (HOSPITAL_COMMUNITY): Payer: Self-pay

## 2023-06-20 ENCOUNTER — Encounter (HOSPITAL_COMMUNITY)
Admission: RE | Admit: 2023-06-20 | Discharge: 2023-06-20 | Disposition: A | Discharge status: Home / Self Care | Source: Ambulatory Visit | Attending: Urology | Admitting: Urology

## 2023-06-20 VITALS — BP 114/78 | HR 82 | Temp 98.1°F | Resp 16 | Ht 64.5 in | Wt 148.0 lb

## 2023-06-20 DIAGNOSIS — Z01810 Encounter for preprocedural cardiovascular examination: Secondary | ICD-10-CM | POA: Diagnosis present

## 2023-06-20 DIAGNOSIS — Z01812 Encounter for preprocedural laboratory examination: Secondary | ICD-10-CM | POA: Diagnosis present

## 2023-06-20 DIAGNOSIS — Z01818 Encounter for other preprocedural examination: Secondary | ICD-10-CM | POA: Insufficient documentation

## 2023-06-20 DIAGNOSIS — R9431 Abnormal electrocardiogram [ECG] [EKG]: Secondary | ICD-10-CM | POA: Insufficient documentation

## 2023-06-20 HISTORY — DX: Personal history of urinary calculi: Z87.442

## 2023-06-20 HISTORY — DX: Gastro-esophageal reflux disease without esophagitis: K21.9

## 2023-06-20 HISTORY — DX: Other specified postprocedural states: Z98.890

## 2023-06-20 LAB — CBC
HCT: 45.3 % (ref 36.0–46.0)
Hemoglobin: 14.4 g/dL (ref 12.0–15.0)
MCH: 28.3 pg (ref 26.0–34.0)
MCHC: 31.8 g/dL (ref 30.0–36.0)
MCV: 89 fL (ref 80.0–100.0)
Platelets: 142 10*3/uL — ABNORMAL LOW (ref 150–400)
RBC: 5.09 MIL/uL (ref 3.87–5.11)
RDW: 14.6 % (ref 11.5–15.5)
WBC: 7.1 10*3/uL (ref 4.0–10.5)
nRBC: 0 % (ref 0.0–0.2)

## 2023-06-20 LAB — BASIC METABOLIC PANEL WITH GFR
Anion gap: 9 (ref 5–15)
BUN: 20 mg/dL (ref 6–20)
CO2: 25 mmol/L (ref 22–32)
Calcium: 10.6 mg/dL — ABNORMAL HIGH (ref 8.9–10.3)
Chloride: 104 mmol/L (ref 98–111)
Creatinine, Ser: 0.46 mg/dL (ref 0.44–1.00)
GFR, Estimated: >60 mL/min (ref >60)
Glucose, Bld: 90 mg/dL (ref 70–99)
Potassium: 4.5 mmol/L (ref 3.5–5.1)
Sodium: 138 mmol/L (ref 135–145)

## 2023-06-20 LAB — GLUCOSE, CAPILLARY: Glucose-Capillary: 83 mg/dL (ref 70–99)

## 2023-06-21 LAB — HEMOGLOBIN A1C
Hgb A1c MFr Bld: 6.3 % — ABNORMAL HIGH (ref 4.8–5.6)
Mean Plasma Glucose: 134 mg/dL

## 2023-06-28 NOTE — H&P (Signed)
 CC/HPI: cc: Urolithiasis   04/17/23: 58 year old woman with a history of pyelolithotomy and pyeloplasty also with history of urolithiasis. She is currently on allopurinol potassium citrate. Patient is on Humira for psoriasis. She was last seen by Dr. Joie Narrow in December 2022. Stone composition 90% uric acid 10% calcium phosphate. Urine pH today 7.5.     ALLERGIES: No Known Allergies    MEDICATIONS: Allopurinol 300 mg tablet 1 tablet PO Daily  Levothyroxine Sodium 100 mcg tablet  Lisinopril 20 mg tablet 1/2 tablet PO Daily  Potassium Citrate Er 15 meq (1,620 mg) tablet, extended release Take 1 tablet by mouth twice daily  Simvastatin 10 mg tablet  Allegra 60 mg capsule Oral  Aspirin Ec 81 mg tablet, delayed release Oral  Flonase Allergy Relief  Humira 20 mg/0.4 ml syringe kit Subcutaneous  Indapamide 1.25 mg tablet 1 tablet PO Q AM  Lantus  Nasacort Aq 55 mcg aerosol, spray Nasal  Nexium 40 mg susp for recon,delayed rel. in a packet Oral  Ozempic 0.25 mg or 0.5 mg dose (2 mg/3 ml) pen injector  Potassium Citrate Er 15 meq (1,620 mg) tablet, extended release 1 tablet PO BID  Skyrizi 150 mg/ml syringe  Synjardy 12.5 mg-1,000 mg tablet  Valacyclovir  Vitamin B12  Vitron-C     GU PSH: ESWL - 2009, 2009 Insert Ureteral Support - 2009 Pyelolithotomy - 2009 Pyeloplasty (open) - 2009       PSH Notes: Pyeloplasty, Ureterotomy For Insertion Of Stent, Pyelotomy With Lithotomy, Lithotripsy, Cystoscopy With Insertion Of Urethral Stent, Lithotripsy, Cholecystectomy, Back Surgery   NON-GU PSH: Cholecystectomy (open) - 2009 Colonoscopy - 2020 Upper GI Endoscopy - 2020     GU PMH: Renal calculus, Stable bilateral renal calculi from what I can tell from the renal ultrasound images - 03/03/2021, Bilateral, - 2021 (Stable), Bilateral, Stones have been stable in size in last 4 years. Will continue to monitor -- no current need for surgical intervention given that they have not been growing  significantly. 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 Stone size about the same as previous - 03/03/2021, - 2019 Encounter for general adult medical examination without abnormal findings, Encounter for preventive health examination - 2016 Personal history of other endocrine, nutritional and metabolic disease, History of thyroid disease - 2016, History of diabetes mellitus, - 2014 Personal history of diseases of the skin and subcutaneous tissue, History of psoriasis - 2014 Personal history of other diseases of the circulatory system, History of hypertension - 2014 Personal history of other diseases of the digestive system, History of esophageal reflux - 2014 Anemia, unspecified Vitamin B12 deficiency anemia, unspecified    FAMILY HISTORY: Family Health Status - Father alive at age 54 - Runs In Family Family Health Status - Mother's Age - Runs In Family Family Health Status Number - Runs In Family No pertinent family history - Other   SOCIAL HISTORY: Marital Status: Married Preferred Language: English; Ethnicity: Not Hispanic Or Latino; Race: White Current Smoking Status: Patient has never smoked.  Has never drank.  Drinks 1 caffeinated drink per day.     Notes: Never A Smoker, Occupation:, Caffeine Use, Marital History - Currently Married, Tobacco Use, Alcohol Use   REVIEW OF SYSTEMS:  GU Review Female:   Patient denies frequent urination, hard to postpone urination, burning /pain with urination, get up at night to urinate, leakage of urine, stream starts and stops, trouble starting your stream, have to strain to urinate, and being pregnant.  Gastrointestinal (Upper):    Patient denies nausea, vomiting, and indigestion/ heartburn.  Gastrointestinal (Lower):   Patient denies diarrhea and constipation.  Constitutional:   Patient denies fever, night sweats, weight loss, and fatigue.  Skin:   Patient denies skin rash/ lesion and itching.  Eyes:   Patient denies blurred vision and double vision.  Ears/ Nose/ Throat:   Patient denies sore throat and sinus problems.  Hematologic/Lymphatic:   Patient denies swollen glands and easy bruising.  Cardiovascular:   Patient denies leg swelling and chest pains.  Respiratory:   Patient denies cough and shortness of breath.  Endocrine:   Patient denies excessive thirst.  Musculoskeletal:   Patient denies back pain and joint pain.  Neurological:   Patient denies headaches and dizziness.  Psychologic:   Patient denies anxiety and depression.   VITAL SIGNS:      04/17/2023 09:40 AM  BP 109/77 mmHg  Pulse 86 /min  Temperature 97.3 F / 36.2 C   MULTI-SYSTEM PHYSICAL EXAMINATION:    Constitutional: Well-nourished. No physical deformities. Normally developed. Good grooming.  Neck: Neck symmetrical, not swollen. Normal tracheal position.  Respiratory: No labored breathing, no use of accessory muscles.   Skin: No paleness, no jaundice, no cyanosis. No lesion, no ulcer, no rash.  Neurologic / Psychiatric: Oriented to time, oriented to place, oriented to person. No depression, no anxiety, no agitation.  Eyes: Normal conjunctivae. Normal eyelids.  Ears, Nose, Mouth, and Throat: Left ear no scars, no lesions, no masses. Right ear no scars, no lesions, no masses. Nose no scars, no lesions, no masses. Normal hearing. Normal lips.  Musculoskeletal: Normal gait and station of head and neck.     Complexity of Data:  Records Review:   Previous Patient Records, POC Tool  Urine Test Review:   Urinalysis  X-Ray Review: KUB: Reviewed Films. Discussed With Patient. Patient was slight increase in size of left renal calculus measuring 17 mm.  She also has more prominent right sided calculi the largest measuring 6 mm.    PROCEDURES:         KUB - [id number]  A single view of the abdomen is obtained.      Patient confirmed No Neulasta OnPro Device.           Urinalysis Dipstick Dipstick Cont'd  Color: Yellow Bilirubin: Neg mg/dL  Appearance: Clear Ketones: Neg mg/dL  Specific Gravity: 1.308 Blood: Neg ery/uL  pH: 7.5 Protein: Neg mg/dL  Glucose: 2+ mg/dL Urobilinogen: 0.2 mg/dL    Nitrites: Neg    Leukocyte Esterase: Neg leu/uL    ASSESSMENT:      ICD-10 Details  1 GU:   Renal calculus - N20.0 Chronic, Worsening   PLAN:           Document Letter(s):  Created for Patient: Clinical Summary         Notes:   1. Urolithiasis:  -Patient to continue potassium citrate and allopurinol  -KUB reviewed and overall stone burden slightly increased. We discussed management of stones including observation, ESWL and ureteroscopy.  -Risk and benefits of bilateral ureteroscopy with laser lithotripsy and stent placement discussed with the patient including but not limited to pain, bleeding, infection, damage to surrounding structures, need for staged procedure, and later  move all stones, need for additional treatment, ureteral stricture   Will schedule for April (grandson's spring break)

## 2023-06-28 NOTE — Anesthesia Preprocedure Evaluation (Signed)
 Anesthesia Evaluation  Patient identified by MRN, date of birth, ID band Patient awake    Reviewed: Allergy & Precautions, H&P , NPO status , Patient's Chart, lab work & pertinent test results  History of Anesthesia Complications (+) PONV and history of anesthetic complications  Airway Mallampati: II  TM Distance: >3 FB Neck ROM: Full    Dental no notable dental hx.    Pulmonary neg pulmonary ROS   Pulmonary exam normal breath sounds clear to auscultation       Cardiovascular hypertension, Normal cardiovascular exam Rhythm:Regular Rate:Normal     Neuro/Psych negative neurological ROS  negative psych ROS   GI/Hepatic Neg liver ROS,GERD  ,,  Endo/Other  diabetes Hyperthyroidism   Renal/GU BILATERAL RENAL CALCULI  negative genitourinary   Musculoskeletal negative musculoskeletal ROS (+)    Abdominal   Peds negative pediatric ROS (+)  Hematology  (+) Blood dyscrasia, anemia   Anesthesia Other Findings   Reproductive/Obstetrics negative OB ROS                             Anesthesia Physical Anesthesia Plan  ASA: 3  Anesthesia Plan: General   Post-op Pain Management:    Induction: Intravenous  PONV Risk Score and Plan: 3 and Ondansetron, Dexamethasone, Midazolam and Treatment may vary due to age or medical condition  Airway Management Planned: LMA  Additional Equipment:   Intra-op Plan:   Post-operative Plan: Extubation in OR  Informed Consent: I have reviewed the patients History and Physical, chart, labs and discussed the procedure including the risks, benefits and alternatives for the proposed anesthesia with the patient or authorized representative who has indicated his/her understanding and acceptance.     Dental advisory given  Plan Discussed with: CRNA  Anesthesia Plan Comments:         Anesthesia Quick Evaluation

## 2023-06-29 ENCOUNTER — Other Ambulatory Visit: Payer: Self-pay

## 2023-06-29 ENCOUNTER — Encounter (HOSPITAL_COMMUNITY)
Admission: RE | Disposition: A | Discharge status: Home / Self Care | Payer: Self-pay | Source: Home / Self Care | Attending: Urology

## 2023-06-29 ENCOUNTER — Ambulatory Visit (HOSPITAL_COMMUNITY)
Admission: RE | Admit: 2023-06-29 | Discharge: 2023-06-29 | Disposition: A | Discharge status: Home / Self Care | Payer: 59 | Attending: Urology | Admitting: Urology

## 2023-06-29 ENCOUNTER — Ambulatory Visit (HOSPITAL_BASED_OUTPATIENT_CLINIC_OR_DEPARTMENT_OTHER)

## 2023-06-29 ENCOUNTER — Encounter (HOSPITAL_COMMUNITY): Payer: Self-pay | Admitting: Urology

## 2023-06-29 ENCOUNTER — Ambulatory Visit (HOSPITAL_COMMUNITY)

## 2023-06-29 ENCOUNTER — Ambulatory Visit (HOSPITAL_COMMUNITY): Payer: Self-pay | Admitting: Physician Assistant

## 2023-06-29 DIAGNOSIS — I10 Essential (primary) hypertension: Secondary | ICD-10-CM | POA: Diagnosis not present

## 2023-06-29 DIAGNOSIS — E039 Hypothyroidism, unspecified: Secondary | ICD-10-CM

## 2023-06-29 DIAGNOSIS — N200 Calculus of kidney: Secondary | ICD-10-CM | POA: Insufficient documentation

## 2023-06-29 DIAGNOSIS — Z7982 Long term (current) use of aspirin: Secondary | ICD-10-CM | POA: Insufficient documentation

## 2023-06-29 DIAGNOSIS — Z794 Long term (current) use of insulin: Secondary | ICD-10-CM | POA: Diagnosis not present

## 2023-06-29 DIAGNOSIS — D649 Anemia, unspecified: Secondary | ICD-10-CM | POA: Diagnosis not present

## 2023-06-29 DIAGNOSIS — L409 Psoriasis, unspecified: Secondary | ICD-10-CM | POA: Diagnosis not present

## 2023-06-29 DIAGNOSIS — E119 Type 2 diabetes mellitus without complications: Secondary | ICD-10-CM | POA: Diagnosis not present

## 2023-06-29 DIAGNOSIS — Z79899 Other long term (current) drug therapy: Secondary | ICD-10-CM | POA: Diagnosis not present

## 2023-06-29 DIAGNOSIS — Z01818 Encounter for other preprocedural examination: Secondary | ICD-10-CM

## 2023-06-29 DIAGNOSIS — K219 Gastro-esophageal reflux disease without esophagitis: Secondary | ICD-10-CM | POA: Insufficient documentation

## 2023-06-29 DIAGNOSIS — E0590 Thyrotoxicosis, unspecified without thyrotoxic crisis or storm: Secondary | ICD-10-CM | POA: Diagnosis not present

## 2023-06-29 HISTORY — PX: CYSTOSCOPY/URETEROSCOPY/HOLMIUM LASER/STENT PLACEMENT: SHX6546

## 2023-06-29 HISTORY — DX: Disorder of bone density and structure, unspecified: M85.9

## 2023-06-29 LAB — GLUCOSE, CAPILLARY
Glucose-Capillary: 157 mg/dL — ABNORMAL HIGH (ref 70–99)
Glucose-Capillary: 141 mg/dL — ABNORMAL HIGH (ref 70–99)
Glucose-Capillary: 153 mg/dL — ABNORMAL HIGH (ref 70–99)

## 2023-06-29 SURGERY — CYSTOSCOPY/URETEROSCOPY/HOLMIUM LASER/STENT PLACEMENT
Anesthesia: General | Laterality: Bilateral

## 2023-06-29 MED ORDER — DEXAMETHASONE SODIUM PHOSPHATE 10 MG/ML IJ SOLN
INTRAMUSCULAR | Status: DC | PRN
  Administered 2023-06-29: 5 mg via INTRAVENOUS

## 2023-06-29 MED ORDER — ONDANSETRON HCL 4 MG/2ML IJ SOLN
INTRAMUSCULAR | Status: AC
  Filled 2023-06-29: qty 2

## 2023-06-29 MED ORDER — OXYCODONE HCL 5 MG/5ML PO SOLN: 5.0000 mg | Freq: Once | ORAL | Status: AC | PRN

## 2023-06-29 MED ORDER — 0.9 % SODIUM CHLORIDE (POUR BTL) OPTIME
TOPICAL | Status: DC | PRN
Start: 2023-06-29 — End: 2023-06-29
  Administered 2023-06-29: 1000 mL

## 2023-06-29 MED ORDER — OXYCODONE HCL 5 MG PO TABS
ORAL_TABLET | ORAL | Status: AC
  Filled 2023-06-29: qty 1

## 2023-06-29 MED ORDER — INSULIN ASPART 100 UNIT/ML IJ SOLN
0.0000 [IU] | INTRAMUSCULAR | Status: AC | PRN
  Administered 2023-06-29 (×2): 2 [IU] via SUBCUTANEOUS
  Filled 2023-06-29: qty 1

## 2023-06-29 MED ORDER — DROPERIDOL 2.5 MG/ML IJ SOLN
0.6250 mg | Freq: Once | INTRAMUSCULAR | Status: AC | PRN
  Administered 2023-06-29: 0.625 mg via INTRAVENOUS

## 2023-06-29 MED ORDER — SODIUM CHLORIDE 0.9 % IR SOLN
Status: DC | PRN
  Administered 2023-06-29: 3000 mL via INTRAVESICAL

## 2023-06-29 MED ORDER — ACETAMINOPHEN 500 MG PO TABS
1000.0000 mg | ORAL_TABLET | Freq: Once | ORAL | Status: AC
  Administered 2023-06-29: 1000 mg via ORAL
  Filled 2023-06-29: qty 2

## 2023-06-29 MED ORDER — OXYCODONE HCL 5 MG PO TABS
5.0000 mg | ORAL_TABLET | Freq: Once | ORAL | Status: AC | PRN
  Administered 2023-06-29: 5 mg via ORAL

## 2023-06-29 MED ORDER — DEXAMETHASONE SODIUM PHOSPHATE 10 MG/ML IJ SOLN
INTRAMUSCULAR | Status: AC
  Filled 2023-06-29: qty 1

## 2023-06-29 MED ORDER — HYDROCODONE-ACETAMINOPHEN 5-325 MG PO TABS: 1.0000 | ORAL_TABLET | Freq: Four times a day (QID) | ORAL | 0 refills | Status: DC | PRN

## 2023-06-29 MED ORDER — MIDAZOLAM HCL 2 MG/2ML IJ SOLN
INTRAMUSCULAR | Status: AC
  Filled 2023-06-29: qty 2

## 2023-06-29 MED ORDER — ORAL CARE MOUTH RINSE: 15.0000 mL | Freq: Once | OROMUCOSAL | Status: AC

## 2023-06-29 MED ORDER — HYOSCYAMINE SULFATE 0.125 MG PO TBDP: 0.1250 mg | ORAL_TABLET | Freq: Four times a day (QID) | ORAL | 0 refills | Status: DC | PRN

## 2023-06-29 MED ORDER — LIDOCAINE HCL (PF) 2 % IJ SOLN
INTRAMUSCULAR | Status: AC
  Filled 2023-06-29: qty 5

## 2023-06-29 MED ORDER — DROPERIDOL 2.5 MG/ML IJ SOLN
INTRAMUSCULAR | Status: AC
  Filled 2023-06-29: qty 2

## 2023-06-29 MED ORDER — MIDAZOLAM HCL 5 MG/5ML IJ SOLN
INTRAMUSCULAR | Status: DC | PRN
  Administered 2023-06-29: 2 mg via INTRAVENOUS

## 2023-06-29 MED ORDER — FENTANYL CITRATE (PF) 100 MCG/2ML IJ SOLN
INTRAMUSCULAR | Status: DC | PRN
  Administered 2023-06-29: 25 ug via INTRAVENOUS
  Administered 2023-06-29: 50 ug via INTRAVENOUS

## 2023-06-29 MED ORDER — CHLORHEXIDINE GLUCONATE 0.12 % MT SOLN
15.0000 mL | Freq: Once | OROMUCOSAL | Status: AC
  Administered 2023-06-29: 15 mL via OROMUCOSAL

## 2023-06-29 MED ORDER — TAMSULOSIN HCL 0.4 MG PO CAPS: 0.4000 mg | ORAL_CAPSULE | Freq: Every day | ORAL | 0 refills | Status: DC

## 2023-06-29 MED ORDER — CEFAZOLIN SODIUM-DEXTROSE 2-4 GM/100ML-% IV SOLN
2.0000 g | INTRAVENOUS | Status: AC
  Administered 2023-06-29: 2 g via INTRAVENOUS
  Filled 2023-06-29: qty 100

## 2023-06-29 MED ORDER — FENTANYL CITRATE PF 50 MCG/ML IJ SOSY: 25.0000 ug | PREFILLED_SYRINGE | INTRAMUSCULAR | Status: DC | PRN

## 2023-06-29 MED ORDER — LIDOCAINE HCL (PF) 2 % IJ SOLN
INTRAMUSCULAR | Status: DC | PRN
Start: 2023-06-29 — End: 2023-06-29
  Administered 2023-06-29: 100 mg via INTRADERMAL

## 2023-06-29 MED ORDER — LACTATED RINGERS IV SOLN: INTRAVENOUS | Status: DC

## 2023-06-29 MED ORDER — IOHEXOL 300 MG/ML  SOLN
INTRAMUSCULAR | Status: DC | PRN
  Administered 2023-06-29: 10 mL

## 2023-06-29 MED ORDER — CEPHALEXIN 500 MG PO CAPS: 500.0000 mg | ORAL_CAPSULE | Freq: Once | ORAL | 0 refills | Status: AC

## 2023-06-29 MED ORDER — FENTANYL CITRATE (PF) 100 MCG/2ML IJ SOLN
INTRAMUSCULAR | Status: AC
  Filled 2023-06-29: qty 2

## 2023-06-29 MED ORDER — ACETAMINOPHEN 10 MG/ML IV SOLN: 1000.0000 mg | Freq: Once | INTRAVENOUS | Status: DC | PRN

## 2023-06-29 MED ORDER — PROPOFOL 10 MG/ML IV BOLUS
INTRAVENOUS | Status: AC
  Filled 2023-06-29: qty 20

## 2023-06-29 MED ORDER — ONDANSETRON HCL 4 MG/2ML IJ SOLN
INTRAMUSCULAR | Status: DC | PRN
  Administered 2023-06-29: 4 mg via INTRAVENOUS

## 2023-06-29 MED ORDER — PROPOFOL 10 MG/ML IV BOLUS
INTRAVENOUS | Status: DC | PRN
  Administered 2023-06-29: 130 mg via INTRAVENOUS

## 2023-06-29 SURGICAL SUPPLY — 21 items
BAG URO CATCHER STRL LF (MISCELLANEOUS) ×2 IMPLANT
BASKET ZERO TIP NITINOL 2.4FR (BASKET) IMPLANT
CATH URETL OPEN 5X70 (CATHETERS) ×2 IMPLANT
CLOTH BEACON ORANGE TIMEOUT ST (SAFETY) ×2 IMPLANT
DRSG TEGADERM 2-3/8X2-3/4 SM (GAUZE/BANDAGES/DRESSINGS) IMPLANT
EXTRACTOR STONE 1.7FRX115CM (UROLOGICAL SUPPLIES) IMPLANT
FIBER LASER MOSES 200 DFL (Laser) IMPLANT
FIBER LASER MOSES 365 DFL (Laser) IMPLANT
GLOVE BIO SURGEON STRL SZ 6.5 (GLOVE) ×2 IMPLANT
GOWN STRL REUS W/ TWL LRG LVL3 (GOWN DISPOSABLE) ×2 IMPLANT
GUIDEWIRE STR DUAL SENSOR (WIRE) ×2 IMPLANT
KIT TURNOVER KIT A (KITS) IMPLANT
LASER FIB FLEXIVA PULSE ID 365 (Laser) IMPLANT
MANIFOLD NEPTUNE II (INSTRUMENTS) ×2 IMPLANT
PACK CYSTO (CUSTOM PROCEDURE TRAY) ×2 IMPLANT
SHEATH NAVIGATOR HD 11/13X28 (SHEATH) IMPLANT
SHEATH NAVIGATOR HD 11/13X36 (SHEATH) IMPLANT
STENT URET 6FRX24 CONTOUR (STENTS) IMPLANT
TRACTIP FLEXIVA PULS ID 200XHI (Laser) IMPLANT
TUBING CONNECTING 10 (TUBING) ×2 IMPLANT
TUBING UROLOGY SET (TUBING) ×2 IMPLANT

## 2023-06-29 NOTE — Anesthesia Postprocedure Evaluation (Signed)
 Anesthesia Post Note  Patient: Kelli Brandt  Procedure(s) Performed: CYSTOSCOPY/BILATERAL URETEROSCOPY/HOLMIUM LASER/STENT PLACEMENT/RETROGRADE PYELOGRAM (Bilateral)     Patient location during evaluation: PACU Anesthesia Type: General Level of consciousness: awake and alert Pain management: pain level controlled Vital Signs Assessment: post-procedure vital signs reviewed and stable Respiratory status: spontaneous breathing, nonlabored ventilation, respiratory function stable and patient connected to nasal cannula oxygen Cardiovascular status: blood pressure returned to baseline and stable Postop Assessment: no apparent nausea or vomiting Anesthetic complications: no   No notable events documented.  Last Vitals:  Vitals:   06/29/23 0945 06/29/23 1018  BP: 128/83 127/88  Pulse: 88 75  Resp: 10 12  Temp:    SpO2: 100% 100%    Last Pain:  Vitals:   06/29/23 1048  TempSrc:   PainSc: 5                  Lethaniel Rave

## 2023-06-29 NOTE — Anesthesia Procedure Notes (Signed)
 Procedure Name: LMA Insertion Date/Time: 06/29/2023 7:36 AM  Performed by: Micky Albee, CRNAPre-anesthesia Checklist: Patient identified, Emergency Drugs available, Suction available, Patient being monitored and Timeout performed Patient Re-evaluated:Patient Re-evaluated prior to induction Oxygen Delivery Method: Circle system utilized Preoxygenation: Pre-oxygenation with 100% oxygen Induction Type: IV induction LMA: LMA inserted LMA Size: 4.0 Tube type: Oral Number of attempts: 1 Placement Confirmation: positive ETCO2 and breath sounds checked- equal and bilateral Tube secured with: Tape Dental Injury: Teeth and Oropharynx as per pre-operative assessment

## 2023-06-29 NOTE — Interval H&P Note (Signed)
 History and Physical Interval Note:  06/29/2023 7:23 AM  Kelli Brandt  has presented today for surgery, with the diagnosis of BILATERAL RENAL CALCULI.  The various methods of treatment have been discussed with the patient and family. After consideration of risks, benefits and other options for treatment, the patient has consented to  Procedure(s) with comments: CYSTOSCOPY/BILATERAL URETEROSCOPY/HOLMIUM LASER/STENT PLACEMENT/RETROGRADE PYELOGRAM (Bilateral) - LENGTH OF SURGERY: 120 MINUTES as a surgical intervention.  The patient's history has been reviewed, patient examined, no change in status, stable for surgery.  I have reviewed the patient's chart and labs.  Questions were answered to the patient's satisfaction.     Raiza Kiesel D Joenathan Sakuma

## 2023-06-29 NOTE — Transfer of Care (Signed)
 Immediate Anesthesia Transfer of Care Note  Patient: Kelli Brandt  Procedure(s) Performed: CYSTOSCOPY/BILATERAL URETEROSCOPY/HOLMIUM LASER/STENT PLACEMENT/RETROGRADE PYELOGRAM (Bilateral)  Patient Location: PACU  Anesthesia Type:General  Level of Consciousness: sedated  Airway & Oxygen Therapy: Patient Spontanous Breathing and Patient connected to face mask oxygen  Post-op Assessment: Report given to RN and Post -op Vital signs reviewed and stable  Post vital signs: Reviewed and stable  Last Vitals:  Vitals Value Taken Time  BP 124/82 06/29/23 0921  Temp    Pulse 95 06/29/23 0921  Resp 10 06/29/23 0921  SpO2 96 % 06/29/23 0921  Vitals shown include unfiled device data.  Last Pain:  Vitals:   06/29/23 0609  TempSrc:   PainSc: 0-No pain      Patients Stated Pain Goal: 4 (06/29/23 1610)  Complications: No notable events documented.

## 2023-06-29 NOTE — Op Note (Signed)
 Preoperative diagnosis: bilateral renal calculi  Postoperative diagnosis: bilateral renal calculi  Procedure:  Cystoscopy bilateral ureteroscopy, laser lithotripsy, basket stone extraction bilateral 63F x 24 ureteral stent placement - with tethers bilateral retrograde pyelography with interpretation  Surgeon: Perley Bradley, MD  Anesthesia: General  Complications: None  Intraoperative findings:  Normal urethra Bilateral orthotropic ureteral orifices Right retrograde pyelogram demonstrated normal ureter without filling defects. Renal pelvis appeared full but no obstruction. Left retrograde pyelogram with tortuous UPJ and dilated renal pelvis consistent with prior pyeloplasty Bladder mucosa normal without masses   EBL: Minimal  Specimens: bilateral renal calculi  Disposition of specimens: [facility] for stone analysis  Indication: Kelli Brandt is a 58 y.o.   patient with bilateral renal stones.  After reviewing the management options for treatment, the patient elected to proceed with the above surgical procedure(s). We have discussed the potential benefits and risks of the procedure, side effects of the proposed treatment, the likelihood of the patient achieving the goals of the procedure, and any potential problems that might occur during the procedure or recuperation. Informed consent has been obtained.   Description of procedure:  The patient was taken to the operating room and general anesthesia was induced.  The patient was placed in the dorsal lithotomy position, prepped and draped in the usual sterile fashion, and preoperative antibiotics were administered. A preoperative time-out was performed.   Cystourethroscopy was performed.  The patient's urethra was examined and was normal.  The bladder was then systematically examined in its entirety. There was no evidence for any bladder tumors, stones, or other mucosal pathology.    Attention then turned to  the right ureteral orifice and a ureteral catheter was used to intubate the ureteral orifice.  Omnipaque contrast was injected through the ureteral catheter and a retrograde pyelogram was performed with findings as dictated above.  A sensor wire is advanced through the open-ended ureteral catheter to the kidney and fluoroscopic guidance.  The ureteral catheter was removed.  A second sensor wire was placed alongside the existing wire and again up to the kidney with fluoroscopy.  1 wire was secured as a safety wire.  The ureteral access sheath was placed over the second wire and advanced the proximal ureter with fluoroscopic guidance.  The inner sheath and wire removed.  Flexible ureteroscopy then took place.  There were 2 larger stones seen in the lower pole.  The stones were then fragmented with the 365 micron holmium laser fiber. All stones were then removed from the ureter with a 0 tip basket.  Reinspection of the ureter revealed no remaining visible stones or fragments.  The ureteral access sheath was then removed in unison with the ureteroscope taking care to examine the ureter on the way out.  There is no trauma or injury noted to the ureter.  The wire was then backloaded through the cystoscope and a ureteral stent was advance over the wire using Seldinger technique.  The stent was positioned appropriately under fluoroscopic and cystoscopic guidance.  The wire was then removed with an adequate stent curl noted in the renal pelvis as well as in the bladder.  Attention then turned to the left ureteral orifice and a retrograde pyelogram was obtained in similar manner.  This demonstrated evidence of prior pyeloplasty with a tortuous UPJ and dilated renal pelvis.  2 wires were placed with fluoroscopic guidance.  The ureteral access sheath was advanced over one of the wires with fluoroscopic guidance.  The inner sheath and wire  removed.  Flexible ureteroscopy then took place and the larger stone was seen in the  lower pole.  Due to the angle and deflection of the scope this was a challenging location.  Again the stone was fragmented with a 365 holmium laser fiber.  The larger pieces were removed with a 0 tip basket.  Some of the remaining fragments were in the lower pole were inaccessible due to angle and limitation of scope.  The ureteroscope was then removed in unison with the access sheath taking care to examine the ureter on the way out.  There is no trauma noted to the ureter.  Again the wire was backloaded through the scope and a ureteral stent was advanced over the wire and positioned appropriately with fluoroscopic and cystoscopic guidance.The bladder was then emptied and the procedure ended.  The patient appeared to tolerate the procedure well and without complications.  The patient was able to be awakened and transferred to the recovery unit in satisfactory condition.   Disposition: The tether of the stents were left on and tucked inside the patient's vagina.  Instructions for removing the stent have been provided to the patient.

## 2023-06-29 NOTE — Discharge Instructions (Addendum)
 DISCHARGE INSTRUCTIONS FOR KIDNEY STONE/URETERAL STENT   MEDICATIONS:  1. Resume all your other meds from home  2. AZO over the counter can help with the burning/stinging when you urinate. 3. Hydrocodone-acetaminophen is for moderate/severe pain, otherwise taking up to 1000 mg every 6 hours of plainTylenol will help treat your pain.   4. Take Cephalexin one hour prior to removal of your stents.  5. Hyoscyamine can help with bladder spasms and tamsulosin can help with stent discomfort   ACTIVITY:  1. No strenuous activity x 1week  2. No driving while on narcotic pain medications  3. Drink plenty of water  4. Continue to walk at home - you can still get blood clots when you are at home, so keep active, but don't over do it.  5. May return to work/school tomorrow or when you feel ready   BATHING:  1. You can shower and we recommend daily showers  2. You have a string coming from your urethra: The stent string is attached to your ureteral stent. Do not pull on this.   SIGNS/SYMPTOMS TO CALL:  Please call us  if you have a fever greater than 101.5, uncontrolled nausea/vomiting, uncontrolled pain, dizziness, unable to urinate, bloody urine, chest pain, shortness of breath, leg swelling, leg pain, redness around wound, drainage from wound, or any other concerns or questions.   You can reach us  at [phone].   FOLLOW-UP:  1. You have strings attached to your stents, you may remove it on Monday, April 21st. To do this, pull the strings until the stents are completely removed. You may feel an odd sensation in your back.

## 2023-06-30 ENCOUNTER — Encounter (HOSPITAL_COMMUNITY): Payer: Self-pay | Admitting: Urology

## 2023-07-25 ENCOUNTER — Other Ambulatory Visit (HOSPITAL_COMMUNITY): Payer: Self-pay | Admitting: Nurse Practitioner

## 2023-07-25 DIAGNOSIS — E210 Primary hyperparathyroidism: Secondary | ICD-10-CM

## 2023-08-03 ENCOUNTER — Encounter (HOSPITAL_COMMUNITY)
Admission: RE | Admit: 2023-08-03 | Discharge: 2023-08-03 | Disposition: A | Discharge status: Home / Self Care | Source: Ambulatory Visit | Attending: Nurse Practitioner | Admitting: Nurse Practitioner

## 2023-08-03 DIAGNOSIS — E210 Primary hyperparathyroidism: Secondary | ICD-10-CM | POA: Diagnosis present

## 2023-08-03 MED ORDER — TECHNETIUM TC 99M SESTAMIBI - CARDIOLITE
25.4000 | Freq: Once | INTRAVENOUS | Status: AC | PRN
  Administered 2023-08-03: 25.4 via INTRAVENOUS

## 2023-08-04 ENCOUNTER — Encounter: Payer: 59 | Admitting: Internal Medicine

## 2023-08-11 ENCOUNTER — Other Ambulatory Visit (HOSPITAL_BASED_OUTPATIENT_CLINIC_OR_DEPARTMENT_OTHER): Payer: Self-pay | Admitting: Nurse Practitioner

## 2023-08-11 ENCOUNTER — Ambulatory Visit (HOSPITAL_BASED_OUTPATIENT_CLINIC_OR_DEPARTMENT_OTHER)
Admission: RE | Admit: 2023-08-11 | Discharge: 2023-08-11 | Disposition: A | Discharge status: Home / Self Care | Source: Ambulatory Visit | Attending: Nurse Practitioner | Admitting: Nurse Practitioner

## 2023-08-11 DIAGNOSIS — D351 Benign neoplasm of parathyroid gland: Secondary | ICD-10-CM | POA: Diagnosis present

## 2023-09-06 ENCOUNTER — Ambulatory Visit: Payer: Self-pay | Admitting: Surgery

## 2023-09-06 NOTE — H&P (View-Only) (Signed)
 REFERRING PHYSICIAN:  Thresa Carmelita Stabs, NP   PROVIDER:  Maggy Wyble OZELL SPINNER, MD     Chief Complaint: New Consultation ( hyperparathyroidism)   History of Present Illness:   Patient is referred by Carmelita Thresa, NP, for surgical evaluation and management of newly diagnosed primary hyperparathyroidism.  Patient was noted by her primary care physician, Dr. Sari Pay, to have hypercalcemia.  This has been present for approximately 1 to 2 years.  Recent calcium level was as high as 11.0.  Patient underwent further studies including a PTH level which was unsuppressed at 43.  A 24-hour urine collection for calcium was markedly elevated at 749.  25-hydroxy vitamin D level was normal at 41.5.  Patient has a history of fatigue.  She has had bone and joint discomfort.  She has had multiple episodes of nephrolithiasis.  She has had a bone scan showing bone loss.  Patient underwent nuclear medicine parathyroid  scan on Aug 07, 2023.  This localized a right inferior parathyroid  adenoma.  Subsequent ultrasound was performed on August 19, 2023.  This identified a parathyroid  adenoma in the right inferior position measuring 2.3 cm in greatest dimension.  Also noted were right sided thyroid  nodules which will require annual ultrasound follow-up in the future.  Patient has had no prior head or neck surgery.  There is no family history of parathyroid  disease.  There is no family history of endocrine neoplasm.  Patient presents today to discuss parathyroid  surgery.     Review of Systems: A complete review of systems was obtained from the patient.  I have reviewed this information and discussed as appropriate with the patient.  See HPI as well for other ROS.   Review of Systems  Constitutional:  Positive for malaise/fatigue.  HENT: Negative.    Eyes: Negative.   Respiratory: Negative.    Cardiovascular: Negative.   Gastrointestinal: Negative.   Genitourinary:        Nephrolithiasis  Musculoskeletal:   Positive for joint pain.  Skin: Negative.   Neurological: Negative.   Endo/Heme/Allergies: Negative.   Psychiatric/Behavioral: Negative.          Medical History: Past Medical History      Past Medical History:  Diagnosis Date   GERD (gastroesophageal reflux disease)          Problem List     Patient Active Problem List  Diagnosis   Primary hyperparathyroidism (CMS/HHS-HCC)        Past Surgical History       Past Surgical History:  Procedure Laterality Date   CHOLECYSTECTOMY       COLON SURGERY            Allergies  No Known Allergies     Medications Ordered Prior to Encounter        Current Outpatient Medications on File Prior to Visit  Medication Sig Dispense Refill   allopurinoL (ZYLOPRIM) 300 MG tablet Take 300 mg by mouth once daily       aspirin 81 MG chewable tablet Take 81 mg by mouth once daily       ergocalciferol, vitamin D2, 1,250 mcg (50,000 unit) capsule TAKE 1 CAPSULE BY MOUTH ONCE A WEEK AFTER A MEAL       fexofenadine (ALLEGRA) 180 MG tablet Take 180 mg by mouth once daily       fluticasone propionate (FLONASE) 50 mcg/actuation nasal spray Place 2 sprays into both nostrils once daily       levothyroxine (SYNTHROID) 100 MCG  tablet TAKE 1 TABLET BY MOUTH DAILY ON 6 DAYS PER WEEK, TAKE 1 & 1/2 TABLET ON ONE DAY PER WEEK.       OZEMPIC 0.25 mg or 0.5 mg (2 mg/3 mL) pen injector Inject subcutaneously       simvastatin (ZOCOR) 10 MG tablet TAKE 1 TABLET BY MOUTH IN THE EVENING FOR 90 DAYS       SKYRIZI 150 mg/mL subcutaneous pen injector         SYNJARDY XR 12.5-1,000 mg XR 24 hr biphasic tablet TAKE 2 TABLETS BY MOUTH ONCE DAILY WITH BREAKFAST FOR 90 DAYS       valACYclovir (VALTREX) 1000 MG tablet TAKE 2 TABLETS BY MOUTH ONCE. YOU MAY REPEAT DOSE AFTER 12 HOURS        No current facility-administered medications on file prior to visit.        Family History       Family History  Problem Relation Age of Onset   Hyperlipidemia (Elevated  cholesterol) Mother     High blood pressure (Hypertension) Mother     Diabetes Mother     Breast cancer Mother     Diabetes Father     High blood pressure (Hypertension) Father     Hyperlipidemia (Elevated cholesterol) Father          Tobacco Use History  Social History       Tobacco Use  Smoking Status Never  Smokeless Tobacco Never        Social History  Social History        Socioeconomic History   Marital status: Married  Tobacco Use   Smoking status: Never   Smokeless tobacco: Never  Substance and Sexual Activity   Drug use: Never    Social Drivers of Health        Housing Stability: Unknown (09/06/2023)    Housing Stability Vital Sign     Homeless in the Last Year: No        Objective:         Vitals:      BP: 138/83  Pulse: 88  Temp: 36.7 C (98 F)  SpO2: 97%  Weight: 68.9 kg (152 lb)  Height: 162.6 cm (5' 4)    Body mass index is 26.09 kg/m.   Physical Exam    GENERAL APPEARANCE Comfortable, no acute issues Development: normal Gross deformities: none   SKIN Rash, lesions, ulcers: none Induration, erythema: none Nodules: none palpable   EYES Conjunctiva and lids: normal Pupils: equal   EARS, NOSE, MOUTH, THROAT External ears: no lesion or deformity External nose: no lesion or deformity Hearing: grossly normal   NECK Symmetric: yes Trachea: midline Thyroid : Left thyroid  lobe without palpable abnormality.  Right thyroid  lobe is slightly firm with subtle nodularity without discrete or dominant mass.  There is no associated lymphadenopathy.   CHEST/CV Not assessed   ABDOMEN Not assessed   GENITOURINARY/RECTAL Not assessed   MUSCULOSKELETAL Station and gait: normal Digits and nails: no clubbing or cyanosis Muscle strength: grossly normal all extremities Deformity: none   LYMPHATIC Cervical: none palpable Supraclavicular: none palpable   PSYCHIATRIC Oriented to person, place, and time: yes Mood and affect:  normal for situation Judgment and insight: appropriate for situation       Assessment and Plan:  Diagnoses and all orders for this visit:   Primary hyperparathyroidism (CMS/HHS-HCC)     Patient is referred by her endocrinologist for surgical evaluation and management of newly diagnosed primary hyperparathyroidism.  Patient provided with a copy of Parathyroid  Surgery: Treatment for Your Parathyroid  Gland Problem, published by [employer], 12 pages.  Book reviewed and explained to patient during visit today.   Today we reviewed her clinical history dating back approximately 2 years.  We reviewed her recent laboratory studies.  We reviewed her nuclear medicine parathyroid  scan with sestamibi and the results of her recent ultrasound.  Patient appears to have a right inferior parathyroid  adenoma.  We discussed parathyroid  surgery and I provided her with written literature to review at home.  We discussed minimally invasive parathyroid  surgery as an outpatient procedure.  We discussed the size and location of the surgical incision.  We discussed the risk and benefits of surgery including the risk of recurrent laryngeal nerve injury.  We discussed her postoperative recovery and return to work and normal activities.  The patient understands and wishes to proceed with surgery in the near future.   Krystal Spinner, MD [facility] practice Office: [phone]

## 2023-09-06 NOTE — H&P (Signed)
 REFERRING PHYSICIAN:  Thresa Carmelita Stabs, NP   PROVIDER:  Maggy Wyble OZELL SPINNER, MD     Chief Complaint: New Consultation ( hyperparathyroidism)   History of Present Illness:   Patient is referred by Carmelita Thresa, NP, for surgical evaluation and management of newly diagnosed primary hyperparathyroidism.  Patient was noted by her primary care physician, Dr. Sari Pay, to have hypercalcemia.  This has been present for approximately 1 to 2 years.  Recent calcium level was as high as 11.0.  Patient underwent further studies including a PTH level which was unsuppressed at 43.  A 24-hour urine collection for calcium was markedly elevated at 749.  25-hydroxy vitamin D level was normal at 41.5.  Patient has a history of fatigue.  She has had bone and joint discomfort.  She has had multiple episodes of nephrolithiasis.  She has had a bone scan showing bone loss.  Patient underwent nuclear medicine parathyroid  scan on Aug 07, 2023.  This localized a right inferior parathyroid  adenoma.  Subsequent ultrasound was performed on August 19, 2023.  This identified a parathyroid  adenoma in the right inferior position measuring 2.3 cm in greatest dimension.  Also noted were right sided thyroid  nodules which will require annual ultrasound follow-up in the future.  Patient has had no prior head or neck surgery.  There is no family history of parathyroid  disease.  There is no family history of endocrine neoplasm.  Patient presents today to discuss parathyroid  surgery.     Review of Systems: A complete review of systems was obtained from the patient.  I have reviewed this information and discussed as appropriate with the patient.  See HPI as well for other ROS.   Review of Systems  Constitutional:  Positive for malaise/fatigue.  HENT: Negative.    Eyes: Negative.   Respiratory: Negative.    Cardiovascular: Negative.   Gastrointestinal: Negative.   Genitourinary:        Nephrolithiasis  Musculoskeletal:   Positive for joint pain.  Skin: Negative.   Neurological: Negative.   Endo/Heme/Allergies: Negative.   Psychiatric/Behavioral: Negative.          Medical History: Past Medical History      Past Medical History:  Diagnosis Date   GERD (gastroesophageal reflux disease)          Problem List     Patient Active Problem List  Diagnosis   Primary hyperparathyroidism (CMS/HHS-HCC)        Past Surgical History       Past Surgical History:  Procedure Laterality Date   CHOLECYSTECTOMY       COLON SURGERY            Allergies  No Known Allergies     Medications Ordered Prior to Encounter        Current Outpatient Medications on File Prior to Visit  Medication Sig Dispense Refill   allopurinoL (ZYLOPRIM) 300 MG tablet Take 300 mg by mouth once daily       aspirin 81 MG chewable tablet Take 81 mg by mouth once daily       ergocalciferol, vitamin D2, 1,250 mcg (50,000 unit) capsule TAKE 1 CAPSULE BY MOUTH ONCE A WEEK AFTER A MEAL       fexofenadine (ALLEGRA) 180 MG tablet Take 180 mg by mouth once daily       fluticasone propionate (FLONASE) 50 mcg/actuation nasal spray Place 2 sprays into both nostrils once daily       levothyroxine (SYNTHROID) 100 MCG  tablet TAKE 1 TABLET BY MOUTH DAILY ON 6 DAYS PER WEEK, TAKE 1 & 1/2 TABLET ON ONE DAY PER WEEK.       OZEMPIC 0.25 mg or 0.5 mg (2 mg/3 mL) pen injector Inject subcutaneously       simvastatin (ZOCOR) 10 MG tablet TAKE 1 TABLET BY MOUTH IN THE EVENING FOR 90 DAYS       SKYRIZI 150 mg/mL subcutaneous pen injector         SYNJARDY XR 12.5-1,000 mg XR 24 hr biphasic tablet TAKE 2 TABLETS BY MOUTH ONCE DAILY WITH BREAKFAST FOR 90 DAYS       valACYclovir (VALTREX) 1000 MG tablet TAKE 2 TABLETS BY MOUTH ONCE. YOU MAY REPEAT DOSE AFTER 12 HOURS        No current facility-administered medications on file prior to visit.        Family History       Family History  Problem Relation Age of Onset   Hyperlipidemia (Elevated  cholesterol) Mother     High blood pressure (Hypertension) Mother     Diabetes Mother     Breast cancer Mother     Diabetes Father     High blood pressure (Hypertension) Father     Hyperlipidemia (Elevated cholesterol) Father          Tobacco Use History  Social History       Tobacco Use  Smoking Status Never  Smokeless Tobacco Never        Social History  Social History        Socioeconomic History   Marital status: Married  Tobacco Use   Smoking status: Never   Smokeless tobacco: Never  Substance and Sexual Activity   Drug use: Never    Social Drivers of Health        Housing Stability: Unknown (09/06/2023)    Housing Stability Vital Sign     Homeless in the Last Year: No        Objective:         Vitals:      BP: 138/83  Pulse: 88  Temp: 36.7 C (98 F)  SpO2: 97%  Weight: 68.9 kg (152 lb)  Height: 162.6 cm (5' 4)    Body mass index is 26.09 kg/m.   Physical Exam    GENERAL APPEARANCE Comfortable, no acute issues Development: normal Gross deformities: none   SKIN Rash, lesions, ulcers: none Induration, erythema: none Nodules: none palpable   EYES Conjunctiva and lids: normal Pupils: equal   EARS, NOSE, MOUTH, THROAT External ears: no lesion or deformity External nose: no lesion or deformity Hearing: grossly normal   NECK Symmetric: yes Trachea: midline Thyroid : Left thyroid  lobe without palpable abnormality.  Right thyroid  lobe is slightly firm with subtle nodularity without discrete or dominant mass.  There is no associated lymphadenopathy.   CHEST/CV Not assessed   ABDOMEN Not assessed   GENITOURINARY/RECTAL Not assessed   MUSCULOSKELETAL Station and gait: normal Digits and nails: no clubbing or cyanosis Muscle strength: grossly normal all extremities Deformity: none   LYMPHATIC Cervical: none palpable Supraclavicular: none palpable   PSYCHIATRIC Oriented to person, place, and time: yes Mood and affect:  normal for situation Judgment and insight: appropriate for situation       Assessment and Plan:  Diagnoses and all orders for this visit:   Primary hyperparathyroidism (CMS/HHS-HCC)     Patient is referred by her endocrinologist for surgical evaluation and management of newly diagnosed primary hyperparathyroidism.  Patient provided with a copy of Parathyroid  Surgery: Treatment for Your Parathyroid  Gland Problem, published by [employer], 12 pages.  Book reviewed and explained to patient during visit today.   Today we reviewed her clinical history dating back approximately 2 years.  We reviewed her recent laboratory studies.  We reviewed her nuclear medicine parathyroid  scan with sestamibi and the results of her recent ultrasound.  Patient appears to have a right inferior parathyroid  adenoma.  We discussed parathyroid  surgery and I provided her with written literature to review at home.  We discussed minimally invasive parathyroid  surgery as an outpatient procedure.  We discussed the size and location of the surgical incision.  We discussed the risk and benefits of surgery including the risk of recurrent laryngeal nerve injury.  We discussed her postoperative recovery and return to work and normal activities.  The patient understands and wishes to proceed with surgery in the near future.   Krystal Spinner, MD [facility] practice Office: [phone]

## 2023-09-06 NOTE — Progress Notes (Signed)
 REFERRING PHYSICIAN:  Thresa Carmelita Stabs,*  PROVIDER:  TODD OZELL SPINNER, MD  MRN: [id number] DOB: 10-Jul-1965 DATE OF ENCOUNTER: 09/06/2023  Subjective   Chief Complaint: New Consultation ( hyperparathyroidism)     History of Present Illness:  Patient is referred by Carmelita Thresa, NP, for surgical evaluation and management of newly diagnosed primary hyperparathyroidism.  Patient was noted by her primary care physician, Dr. Sari Pay, to have hypercalcemia.  This has been present for approximately 1 to 2 years.  Recent calcium level was as high as 11.0.  Patient underwent further studies including a PTH level which was unsuppressed at 43.  A 24-hour urine collection for calcium was markedly elevated at 749.  25-hydroxy vitamin D level was normal at 41.5.  Patient has a history of fatigue.  She has had bone and joint discomfort.  She has had multiple episodes of nephrolithiasis.  She has had a bone scan showing bone loss.  Patient underwent nuclear medicine parathyroid  scan on Aug 07, 2023.  This localized a right inferior parathyroid  adenoma.  Subsequent ultrasound was performed on August 19, 2023.  This identified a parathyroid  adenoma in the right inferior position measuring 2.3 cm in greatest dimension.  Also noted were right sided thyroid  nodules which will require annual ultrasound follow-up in the future.  Patient has had no prior head or neck surgery.  There is no family history of parathyroid  disease.  There is no family history of endocrine neoplasm.  Patient presents today to discuss parathyroid  surgery.   Review of Systems: A complete review of systems was obtained from the patient.  I have reviewed this information and discussed as appropriate with the patient.  See HPI as well for other ROS.  Review of Systems  Constitutional:  Positive for malaise/fatigue.  HENT: Negative.    Eyes: Negative.   Respiratory: Negative.    Cardiovascular: Negative.   Gastrointestinal:  Negative.   Genitourinary:        Nephrolithiasis  Musculoskeletal:  Positive for joint pain.  Skin: Negative.   Neurological: Negative.   Endo/Heme/Allergies: Negative.   Psychiatric/Behavioral: Negative.        Medical History: Past Medical History:  Diagnosis Date  . GERD (gastroesophageal reflux disease)     Patient Active Problem List  Diagnosis  . Primary hyperparathyroidism (CMS/HHS-HCC)    Past Surgical History:  Procedure Laterality Date  . CHOLECYSTECTOMY    . COLON SURGERY       No Known Allergies  Current Outpatient Medications on File Prior to Visit  Medication Sig Dispense Refill  . allopurinoL (ZYLOPRIM) 300 MG tablet Take 300 mg by mouth once daily    . aspirin 81 MG chewable tablet Take 81 mg by mouth once daily    . ergocalciferol, vitamin D2, 1,250 mcg (50,000 unit) capsule TAKE 1 CAPSULE BY MOUTH ONCE A WEEK AFTER A MEAL    . fexofenadine (ALLEGRA) 180 MG tablet Take 180 mg by mouth once daily    . fluticasone propionate (FLONASE) 50 mcg/actuation nasal spray Place 2 sprays into both nostrils once daily    . levothyroxine (SYNTHROID) 100 MCG tablet TAKE 1 TABLET BY MOUTH DAILY ON 6 DAYS PER WEEK, TAKE 1 & 1/2 TABLET ON ONE DAY PER WEEK.    SABRA OZEMPIC 0.25 mg or 0.5 mg (2 mg/3 mL) pen injector Inject subcutaneously    . simvastatin (ZOCOR) 10 MG tablet TAKE 1 TABLET BY MOUTH IN THE EVENING FOR 90 DAYS    . SKYRIZI  150 mg/mL subcutaneous pen injector     . SYNJARDY XR 12.5-1,000 mg XR 24 hr biphasic tablet TAKE 2 TABLETS BY MOUTH ONCE DAILY WITH BREAKFAST FOR 90 DAYS    . valACYclovir (VALTREX) 1000 MG tablet TAKE 2 TABLETS BY MOUTH ONCE. YOU MAY REPEAT DOSE AFTER 12 HOURS     No current facility-administered medications on file prior to visit.    Family History  Problem Relation Age of Onset  . Hyperlipidemia (Elevated cholesterol) Mother   . High blood pressure (Hypertension) Mother   . Diabetes Mother   . Breast cancer Mother   . Diabetes  Father   . High blood pressure (Hypertension) Father   . Hyperlipidemia (Elevated cholesterol) Father      Social History   Tobacco Use  Smoking Status Never  Smokeless Tobacco Never     Social History   Socioeconomic History  . Marital status: Married  Tobacco Use  . Smoking status: Never  . Smokeless tobacco: Never  Substance and Sexual Activity  . Drug use: Never   Social Drivers of Health   Housing Stability: Unknown (09/06/2023)   Housing Stability Vital Sign   . Homeless in the Last Year: No    Objective:    Vitals:   09/06/23 1106  BP: 138/83  Pulse: 88  Temp: 36.7 C (98 F)  SpO2: 97%  Weight: 68.9 kg (152 lb)  Height: 162.6 cm (5' 4)    Body mass index is 26.09 kg/m.  Physical Exam   GENERAL APPEARANCE Comfortable, no acute issues Development: normal Gross deformities: none  SKIN Rash, lesions, ulcers: none Induration, erythema: none Nodules: none palpable  EYES Conjunctiva and lids: normal Pupils: equal  EARS, NOSE, MOUTH, THROAT External ears: no lesion or deformity External nose: no lesion or deformity Hearing: grossly normal  NECK Symmetric: yes Trachea: midline Thyroid : Left thyroid  lobe without palpable abnormality.  Right thyroid  lobe is slightly firm with subtle nodularity without discrete or dominant mass.  There is no associated lymphadenopathy.  CHEST/CV Not assessed  ABDOMEN Not assessed  GENITOURINARY/RECTAL Not assessed  MUSCULOSKELETAL Station and gait: normal Digits and nails: no clubbing or cyanosis Muscle strength: grossly normal all extremities Deformity: none  LYMPHATIC Cervical: none palpable Supraclavicular: none palpable  PSYCHIATRIC Oriented to person, place, and time: yes Mood and affect: normal for situation Judgment and insight: appropriate for situation    Assessment and Plan:  Diagnoses and all orders for this visit:  Primary hyperparathyroidism (CMS/HHS-HCC)    Patient is  referred by her endocrinologist for surgical evaluation and management of newly diagnosed primary hyperparathyroidism.  Patient provided with a copy of Parathyroid  Surgery: Treatment for Your Parathyroid  Gland Problem, published by [employer], 12 pages.  Book reviewed and explained to patient during visit today.  Today we reviewed her clinical history dating back approximately 2 years.  We reviewed her recent laboratory studies.  We reviewed her nuclear medicine parathyroid  scan with sestamibi and the results of her recent ultrasound.  Patient appears to have a right inferior parathyroid  adenoma.  We discussed parathyroid  surgery and I provided her with written literature to review at home.  We discussed minimally invasive parathyroid  surgery as an outpatient procedure.  We discussed the size and location of the surgical incision.  We discussed the risk and benefits of surgery including the risk of recurrent laryngeal nerve injury.  We discussed her postoperative recovery and return to work and normal activities.  The patient understands and wishes to proceed with surgery in  the near future.  +++++++++++++++++++++++++++++++++++++++++++++  You are being scheduled for surgery.  You should hear from our office's scheduling department within 3 business days about the location, date, and time of surgery.  We try to make accommodations for patient's preferences in scheduling surgery, but sometimes the Operating Room's schedule or Dr. Ronold schedule prevents us  from making those accommodations.  If you have not heard from our office within 3 business days, call the office and ask for Dr. Ronold nurse [place]).  [facility] OFFICE: [phone]   Krystal Spinner MD [facility] Surgery Office: [phone]

## 2023-09-12 NOTE — Patient Instructions (Addendum)
 SURGICAL WAITING ROOM VISITATION  Patients having surgery or a procedure may have no more than 2 support people in the waiting area - these visitors may rotate.    Children under the age of 10 must have an adult with them who is not the patient.  Visitors with respiratory illnesses are discouraged from visiting and should remain at home.  If the patient needs to stay at the hospital during part of their recovery, the visitor guidelines for inpatient rooms apply. Pre-op nurse will coordinate an appropriate time for 1 support person to accompany patient in pre-op.  This support person may not rotate.    Please refer to the [facility] website for the visitor guidelines for Inpatients (after your surgery is over and you are in a regular room).       Your procedure is scheduled on: 09/29/23   Report to [facility] Main Entrance    Report to admitting at 9:15 AM   Call this number if you have problems the morning of surgery [phone]   Do not eat food :After Midnight.   After Midnight you may have the following liquids until 8:30 AM DAY OF SURGERY  Water Non-Citrus Juices (without pulp, NO RED-Apple, White grape, White cranberry) Black Coffee (NO MILK/CREAM OR CREAMERS, sugar ok)  Clear Tea (NO MILK/CREAM OR CREAMERS, sugar ok) regular and decaf                             Plain Jell-O (NO RED)                                           Fruit ices (not with fruit pulp, NO RED)                                     Popsicles (NO RED)                                                               Sports drinks like Gatorade (NO RED)                     Oral Hygiene is also important to reduce your risk of infection.                                    Remember - BRUSH YOUR TEETH THE MORNING OF SURGERY WITH YOUR REGULAR TOOTHPASTE   Stop all vitamins and herbal supplements 7 days before surgery.   Take these medicines the morning of surgery with A SIP OF WATER: allopurinol,  nexium(esomeprazole),skyrizi, allegra(fexofenadine),nasal spray,simvastatin,tamsulosin (flomax )                           Do not take lisinopril the morning of surgery.  DO NOT TAKE ANY ORAL DIABETIC MEDICATIONS DAY OF YOUR SURGERY Hold Synjardy for 72 hours prior to surgery. Last dose to be 09/25/23. The night before surgery take 50% of bedtime  insulin  dose The morning of surgery take 50% of morning insulin  dose.             You may not have any metal on your body including hair pins, jewelry, and body piercing             Do not wear make-up, lotions, powders, perfumes/cologne, or deodorant  Do not wear nail polish including gel and S&S, artificial/acrylic nails, or any other type of covering on natural nails including finger and toenails. If you have artificial nails, gel coating, etc. that needs to be removed by a nail salon please have this removed prior to surgery or surgery may need to be canceled/ delayed if the surgeon/ anesthesia feels like they are unable to be safely monitored.   Do not shave  48 hours prior to surgery.    Do not bring valuables to the hospital. [place] [facility] IS NOT             RESPONSIBLE   FOR VALUABLES.   Contacts, glasses, dentures or bridgework may not be worn into surgery.  DO NOT BRING YOUR HOME MEDICATIONS TO THE HOSPITAL. PHARMACY WILL DISPENSE MEDICATIONS LISTED ON YOUR MEDICATION LIST TO YOU DURING YOUR ADMISSION IN THE HOSPITAL!    Patients discharged on the day of surgery will not be allowed to drive home.  Someone NEEDS to stay with you for the first 24 hours after anesthesia.   Special Instructions: Bring a copy of your healthcare power of attorney and living will documents the day of surgery if you haven't scanned them before.              Please read over the following fact sheets you were given: IF YOU HAVE QUESTIONS ABOUT YOUR PRE-OP INSTRUCTIONS PLEASE CALL [phone] [place]   If you received a COVID test during your pre-op visit  it is  requested that you wear a mask when out in public, stay away from anyone that may not be feeling well and notify your surgeon if you develop symptoms. If you test positive for Covid or have been in contact with anyone that has tested positive in the last 10 days please notify you surgeon.    [facility] - Preparing for Surgery Before surgery, you can play an important role.  Because skin is not sterile, your skin needs to be as free of germs as possible.  You can reduce the number of germs on your skin by washing with CHG (chlorahexidine gluconate) soap before surgery.  CHG is an antiseptic cleaner which kills germs and bonds with the skin to continue killing germs even after washing. Please DO NOT use if you have an allergy to CHG or antibacterial soaps.  If your skin becomes reddened/irritated stop using the CHG and inform your nurse when you arrive at Short Stay. Do not shave (including legs and underarms) for at least 48 hours prior to the first CHG shower.  You may shave your face/neck.  Please follow these instructions carefully:  1.  Shower with CHG Soap the night before surgery and the  morning of surgery.  2.  If you choose to wash your hair, wash your hair first as usual with your normal  shampoo.  3.  After you shampoo, rinse your hair and body thoroughly to remove the shampoo.                             4.  Use  CHG as you would any other liquid soap.  You can apply chg directly to the skin and wash.  Gently with a scrungie or clean washcloth.  5.  Apply the CHG Soap to your body ONLY FROM THE NECK DOWN.   Do   not use on face/ open                           Wound or open sores. Avoid contact with eyes, ears mouth and   genitals (private parts).                       Wash face,  Genitals (private parts) with your normal soap.             6.  Wash thoroughly, paying special attention to the area where your    surgery  will be performed.  7.  Thoroughly rinse your body with warm water from  the neck down.  8.  DO NOT shower/wash with your normal soap after using and rinsing off the CHG Soap.                9.  Pat yourself dry with a clean towel.            10.  Wear clean pajamas.            11.  Place clean sheets on your bed the night of your first shower and do not  sleep with pets. Day of Surgery : Do not apply any lotions/deodorants the morning of surgery.  Please wear clean clothes to the hospital/surgery center.  FAILURE TO FOLLOW THESE INSTRUCTIONS MAY RESULT IN THE CANCELLATION OF YOUR SURGERY   _How to Manage Your Diabetes Before and After Surgery  Why is it important to control my blood sugar before and after surgery? Improving blood sugar levels before and after surgery helps healing and can limit problems. A way of improving blood sugar control is eating a healthy diet by:  Eating less sugar and carbohydrates  Increasing activity/exercise  Talking with your doctor about reaching your blood sugar goals High blood sugars (greater than 180 mg/dL) can raise your risk of infections and slow your recovery, so you will need to focus on controlling your diabetes during the weeks before surgery. Make sure that the doctor who takes care of your diabetes knows about your planned surgery including the date and location.  How do I manage my blood sugar before surgery? Check your blood sugar at least 4 times a day, starting 2 days before surgery, to make sure that the level is not too high or low. Check your blood sugar the morning of your surgery when you wake up and every 2 hours until you get to the Short Stay unit. If your blood sugar is less than 70 mg/dL, you will need to treat for low blood sugar: Do not take insulin . Treat a low blood sugar (less than 70 mg/dL) with  cup of clear juice (cranberry or apple), 4 glucose tablets, OR glucose gel. Recheck blood sugar in 15 minutes after treatment (to make sure it is greater than 70 mg/dL). If your blood sugar is not  greater than 70 mg/dL on recheck, call [phone] for further instructions. Report your blood sugar to the short stay nurse when you get to Short Stay.  If you are admitted to the hospital after surgery: Your blood sugar will be checked by the  staff and you will probably be given insulin  after surgery (instead of oral diabetes medicines) to make sure you have good blood sugar levels. The goal for blood sugar control after surgery is 80-180 mg/dL.   WHAT DO I DO ABOUT MY DIABETES MEDICATION?  Do not take oral diabetes medicines (pills) the morning of surgery. Hold Synjardy. Last dose7/14/25  THE NIGHT BEFORE SURGERY, take only half of bedtime insulin  dose.       THE MORNING OF SURGERY, take only half of morning insulin  dose.  DO NOT TAKE THE FOLLOWING 7 DAYS PRIOR TO SURGERY: Ozempic, Wegovy, Rybelsus (Semaglutide), Byetta (exenatide), Bydureon (exenatide ER), Victoza, Saxenda (liraglutide), or Trulicity (dulaglutide) Mounjaro (Tirzepatide) Adlyxin (Lixisenatide), Polyethylene Glycol Loxenatide.  Patient Signature:  Date:   Nurse Signature:  Date:

## 2023-09-12 NOTE — Progress Notes (Addendum)
 COVID Vaccine received:  []  No [x]  Yes Date of any COVID positive Test in last 90 days: no PCP - Dr. Sari Pay Cardiologist - n/a  Chest x-ray -  EKG -  06/20/23 Epic Stress Test -  ECHO -  Cardiac Cath -   Bowel Prep - [x]  No  []   Yes ______  Pacemaker / ICD device [x]  No []  Yes   Spinal Cord Stimulator:[x]  No []  Yes       History of Sleep Apnea? [x]  No []  Yes   CPAP used?- [x]  No []  Yes    Does the patient monitor blood sugar?          []  No [x]  Yes  []  N/A  Patient has: []  NO Hx DM   []  Pre-DM                 []  DM1  [x]   DM2 Does patient have a [employer] or Dexacom? [x]  No []  Yes   Fasting Blood Sugar Ranges- 90'2 - 118 Checks Blood Sugar ___2-3 x a week__ times a day  GLP1 agonist / usual dose - Ozempic weekly. Last dose__7/8/25__ GLP1 instructions:  SGLT-2 inhibitors / usual dose - Synjardy- Hold 72 hours. Last dose ___7/14/25__ SGLT-2 instructions:   Blood Thinner / Instructions: Aspirin Instructions:ASA 81mg  last dose 09/23/23  Comments:   Activity level: Patient is able  to climb a flight of stairs without difficulty; [x]  No CP  [x]  No SOB   Patient can  perform ADLs without assistance.   Anesthesia review:   Patient denies shortness of breath, fever, cough and chest pain at PAT appointment.  Patient verbalized understanding and agreement to the Pre-Surgical Instructions that were given to them at this PAT appointment. Patient was also educated of the need to review these PAT instructions again prior to his/her surgery.I reviewed the appropriate phone numbers to call if they have any and questions or concerns.

## 2023-09-18 ENCOUNTER — Other Ambulatory Visit: Payer: Self-pay

## 2023-09-18 ENCOUNTER — Encounter (HOSPITAL_COMMUNITY): Payer: Self-pay

## 2023-09-18 ENCOUNTER — Encounter (HOSPITAL_COMMUNITY)
Admission: RE | Admit: 2023-09-18 | Discharge: 2023-09-18 | Disposition: A | Discharge status: Home / Self Care | Source: Ambulatory Visit | Attending: Surgery | Admitting: Surgery

## 2023-09-18 VITALS — BP 122/84 | HR 91 | Temp 98.0°F | Resp 16 | Ht 64.0 in | Wt 150.0 lb

## 2023-09-18 DIAGNOSIS — Z794 Long term (current) use of insulin: Secondary | ICD-10-CM | POA: Diagnosis not present

## 2023-09-18 DIAGNOSIS — E119 Type 2 diabetes mellitus without complications: Secondary | ICD-10-CM | POA: Insufficient documentation

## 2023-09-18 DIAGNOSIS — Z01812 Encounter for preprocedural laboratory examination: Secondary | ICD-10-CM | POA: Insufficient documentation

## 2023-09-18 DIAGNOSIS — Z01818 Encounter for other preprocedural examination: Secondary | ICD-10-CM

## 2023-09-18 LAB — CBC
HCT: 43.5 % (ref 36.0–46.0)
Hemoglobin: 13.8 g/dL (ref 12.0–15.0)
MCH: 27.4 pg (ref 26.0–34.0)
MCHC: 31.7 g/dL (ref 30.0–36.0)
MCV: 86.5 fL (ref 80.0–100.0)
Platelets: 151 K/uL (ref 150–400)
RBC: 5.03 MIL/uL (ref 3.87–5.11)
RDW: 14.6 % (ref 11.5–15.5)
WBC: 6.4 K/uL (ref 4.0–10.5)
nRBC: 0 % (ref 0.0–0.2)

## 2023-09-18 LAB — BASIC METABOLIC PANEL WITH GFR
Anion gap: 10 (ref 5–15)
BUN: 16 mg/dL (ref 6–20)
CO2: 23 mmol/L (ref 22–32)
Calcium: 9.9 mg/dL (ref 8.9–10.3)
Chloride: 105 mmol/L (ref 98–111)
Creatinine, Ser: 0.41 mg/dL — ABNORMAL LOW (ref 0.44–1.00)
GFR, Estimated: >60 mL/min (ref >60)
Glucose, Bld: 150 mg/dL — ABNORMAL HIGH (ref 70–99)
Potassium: 4.2 mmol/L (ref 3.5–5.1)
Sodium: 138 mmol/L (ref 135–145)

## 2023-09-18 LAB — GLUCOSE, CAPILLARY: Glucose-Capillary: 156 mg/dL — ABNORMAL HIGH (ref 70–99)

## 2023-09-28 NOTE — Anesthesia Preprocedure Evaluation (Signed)
 Anesthesia Evaluation  Patient identified by MRN, date of birth, ID band Patient awake    Reviewed: Allergy & Precautions, H&P , NPO status , Patient's Chart, lab work & pertinent test results  History of Anesthesia Complications (+) PONV and history of anesthetic complications  Airway Mallampati: II  TM Distance: >3 FB Neck ROM: Full    Dental no notable dental hx. (+) Teeth Intact, Dental Advisory Given   Pulmonary neg pulmonary ROS   Pulmonary exam normal breath sounds clear to auscultation       Cardiovascular hypertension, Pt. on medications Normal cardiovascular exam Rhythm:Regular Rate:Normal     Neuro/Psych negative neurological ROS  negative psych ROS   GI/Hepatic Neg liver ROS,GERD  ,,  Endo/Other  diabetes Hyperthyroidism   Renal/GU BILATERAL RENAL CALCULI  negative genitourinary   Musculoskeletal negative musculoskeletal ROS (+)    Abdominal   Peds negative pediatric ROS (+)  Hematology  (+) Blood dyscrasia, anemia   Anesthesia Other Findings   Reproductive/Obstetrics negative OB ROS                              Anesthesia Physical Anesthesia Plan  ASA: 3  Anesthesia Plan: General   Post-op Pain Management: Minimal or no pain anticipated, Tylenol  PO (pre-op)* and Celebrex PO (pre-op)*   Induction: Intravenous  PONV Risk Score and Plan: 3 and Ondansetron , Dexamethasone , Midazolam , Treatment may vary due to age or medical condition, TIVA and Scopolamine patch - Pre-op  Airway Management Planned: Oral ETT  Additional Equipment: None  Intra-op Plan:   Post-operative Plan: Extubation in OR  Informed Consent: I have reviewed the patients History and Physical, chart, labs and discussed the procedure including the risks, benefits and alternatives for the proposed anesthesia with the patient or authorized representative who has indicated his/her understanding and  acceptance.     Dental advisory given  Plan Discussed with: CRNA and Anesthesiologist  Anesthesia Plan Comments:          Anesthesia Quick Evaluation

## 2023-09-29 ENCOUNTER — Encounter (HOSPITAL_COMMUNITY)
Admission: RE | Disposition: A | Discharge status: Home / Self Care | Payer: Self-pay | Source: Ambulatory Visit | Attending: Surgery

## 2023-09-29 ENCOUNTER — Encounter (HOSPITAL_COMMUNITY): Admitting: Anesthesiology

## 2023-09-29 ENCOUNTER — Other Ambulatory Visit: Payer: Self-pay

## 2023-09-29 ENCOUNTER — Encounter (HOSPITAL_COMMUNITY): Payer: Self-pay | Admitting: Surgery

## 2023-09-29 ENCOUNTER — Ambulatory Visit (HOSPITAL_COMMUNITY): Admitting: Anesthesiology

## 2023-09-29 ENCOUNTER — Ambulatory Visit (HOSPITAL_COMMUNITY)
Admission: RE | Admit: 2023-09-29 | Discharge: 2023-09-29 | Disposition: A | Discharge status: Home / Self Care | Source: Ambulatory Visit | Attending: Surgery | Admitting: Surgery

## 2023-09-29 DIAGNOSIS — I10 Essential (primary) hypertension: Secondary | ICD-10-CM

## 2023-09-29 DIAGNOSIS — E119 Type 2 diabetes mellitus without complications: Secondary | ICD-10-CM | POA: Diagnosis not present

## 2023-09-29 DIAGNOSIS — E785 Hyperlipidemia, unspecified: Secondary | ICD-10-CM

## 2023-09-29 DIAGNOSIS — Z7984 Long term (current) use of oral hypoglycemic drugs: Secondary | ICD-10-CM | POA: Insufficient documentation

## 2023-09-29 DIAGNOSIS — E042 Nontoxic multinodular goiter: Secondary | ICD-10-CM | POA: Insufficient documentation

## 2023-09-29 DIAGNOSIS — D351 Benign neoplasm of parathyroid gland: Secondary | ICD-10-CM | POA: Insufficient documentation

## 2023-09-29 DIAGNOSIS — E210 Primary hyperparathyroidism: Secondary | ICD-10-CM

## 2023-09-29 DIAGNOSIS — Z7985 Long-term (current) use of injectable non-insulin antidiabetic drugs: Secondary | ICD-10-CM | POA: Insufficient documentation

## 2023-09-29 HISTORY — PX: PARATHYROIDECTOMY: SHX19

## 2023-09-29 LAB — GLUCOSE, CAPILLARY
Glucose-Capillary: 145 mg/dL — ABNORMAL HIGH (ref 70–99)
Glucose-Capillary: 140 mg/dL — ABNORMAL HIGH (ref 70–99)

## 2023-09-29 SURGERY — PARATHYROIDECTOMY
Anesthesia: General | Site: Neck | Laterality: Right

## 2023-09-29 MED ORDER — LIDOCAINE HCL (PF) 2 % IJ SOLN
INTRAMUSCULAR | Status: AC
  Filled 2023-09-29: qty 5

## 2023-09-29 MED ORDER — MEPERIDINE HCL 25 MG/ML IJ SOLN: 6.2500 mg | INTRAMUSCULAR | Status: DC | PRN

## 2023-09-29 MED ORDER — MIDAZOLAM HCL 5 MG/5ML IJ SOLN
INTRAMUSCULAR | Status: DC | PRN
  Administered 2023-09-29: 2 mg via INTRAVENOUS

## 2023-09-29 MED ORDER — CELECOXIB 200 MG PO CAPS
200.0000 mg | ORAL_CAPSULE | Freq: Once | ORAL | Status: AC
  Administered 2023-09-29: 200 mg via ORAL
  Filled 2023-09-29: qty 1

## 2023-09-29 MED ORDER — BUPIVACAINE HCL 0.25 % IJ SOLN
INTRAMUSCULAR | Status: DC | PRN
  Administered 2023-09-29: 10 mL

## 2023-09-29 MED ORDER — CEFAZOLIN SODIUM-DEXTROSE 2-4 GM/100ML-% IV SOLN
2.0000 g | INTRAVENOUS | Status: AC
  Administered 2023-09-29: 2 g via INTRAVENOUS
  Filled 2023-09-29: qty 100

## 2023-09-29 MED ORDER — SUGAMMADEX SODIUM 200 MG/2ML IV SOLN
INTRAVENOUS | Status: DC | PRN
Start: 2023-09-29 — End: 2023-09-29
  Administered 2023-09-29: 200 mg via INTRAVENOUS

## 2023-09-29 MED ORDER — FENTANYL CITRATE (PF) 100 MCG/2ML IJ SOLN
INTRAMUSCULAR | Status: AC
  Filled 2023-09-29: qty 2

## 2023-09-29 MED ORDER — ROCURONIUM BROMIDE 10 MG/ML (PF) SYRINGE
PREFILLED_SYRINGE | INTRAVENOUS | Status: DC | PRN
Start: 2023-09-29 — End: 2023-09-29
  Administered 2023-09-29: 50 mg via INTRAVENOUS

## 2023-09-29 MED ORDER — DEXAMETHASONE SODIUM PHOSPHATE 10 MG/ML IJ SOLN
INTRAMUSCULAR | Status: AC
  Filled 2023-09-29: qty 1

## 2023-09-29 MED ORDER — ACETAMINOPHEN 500 MG PO TABS
1000.0000 mg | ORAL_TABLET | Freq: Once | ORAL | Status: AC
  Administered 2023-09-29: 1000 mg via ORAL
  Filled 2023-09-29: qty 2

## 2023-09-29 MED ORDER — 0.9 % SODIUM CHLORIDE (POUR BTL) OPTIME
TOPICAL | Status: DC | PRN
  Administered 2023-09-29: 1000 mL

## 2023-09-29 MED ORDER — SCOPOLAMINE 1 MG/3DAYS TD PT72
1.0000 | MEDICATED_PATCH | Freq: Once | TRANSDERMAL | Status: DC
  Administered 2023-09-29: 1.5 mg via TRANSDERMAL
  Filled 2023-09-29: qty 1

## 2023-09-29 MED ORDER — PROPOFOL 10 MG/ML IV BOLUS
INTRAVENOUS | Status: DC | PRN
  Administered 2023-09-29: 150 mg via INTRAVENOUS

## 2023-09-29 MED ORDER — ORAL CARE MOUTH RINSE: 15.0000 mL | Freq: Once | OROMUCOSAL | Status: AC

## 2023-09-29 MED ORDER — FENTANYL CITRATE PF 50 MCG/ML IJ SOSY
PREFILLED_SYRINGE | INTRAMUSCULAR | Status: AC
  Filled 2023-09-29: qty 1

## 2023-09-29 MED ORDER — TRAMADOL HCL 50 MG PO TABS: 50.0000 mg | ORAL_TABLET | Freq: Four times a day (QID) | ORAL | 0 refills | Status: AC | PRN

## 2023-09-29 MED ORDER — PROPOFOL 500 MG/50ML IV EMUL
INTRAVENOUS | Status: DC | PRN
  Administered 2023-09-29: 150 ug/kg/min via INTRAVENOUS

## 2023-09-29 MED ORDER — BUPIVACAINE HCL (PF) 0.25 % IJ SOLN
INTRAMUSCULAR | Status: AC
  Filled 2023-09-29: qty 30

## 2023-09-29 MED ORDER — ONDANSETRON HCL 4 MG/2ML IJ SOLN
INTRAMUSCULAR | Status: AC
  Filled 2023-09-29: qty 2

## 2023-09-29 MED ORDER — DEXAMETHASONE SODIUM PHOSPHATE 10 MG/ML IJ SOLN
INTRAMUSCULAR | Status: DC | PRN
  Administered 2023-09-29: 10 mg via INTRAVENOUS

## 2023-09-29 MED ORDER — HEMOSTATIC AGENTS (NO CHARGE) OPTIME
TOPICAL | Status: DC | PRN
  Administered 2023-09-29: 1 via TOPICAL

## 2023-09-29 MED ORDER — OXYCODONE HCL 5 MG PO TABS: 5.0000 mg | ORAL_TABLET | Freq: Once | ORAL | Status: DC | PRN

## 2023-09-29 MED ORDER — CHLORHEXIDINE GLUCONATE CLOTH 2 % EX PADS: 6.0000 | MEDICATED_PAD | Freq: Once | CUTANEOUS | Status: DC

## 2023-09-29 MED ORDER — MIDAZOLAM HCL 2 MG/2ML IJ SOLN
INTRAMUSCULAR | Status: AC
  Filled 2023-09-29: qty 2

## 2023-09-29 MED ORDER — OXYCODONE HCL 5 MG/5ML PO SOLN: 5.0000 mg | Freq: Once | ORAL | Status: DC | PRN

## 2023-09-29 MED ORDER — INSULIN ASPART 100 UNIT/ML IJ SOLN: 0.0000 [IU] | INTRAMUSCULAR | Status: DC | PRN

## 2023-09-29 MED ORDER — FENTANYL CITRATE (PF) 100 MCG/2ML IJ SOLN
INTRAMUSCULAR | Status: DC | PRN
  Administered 2023-09-29: 100 ug via INTRAVENOUS

## 2023-09-29 MED ORDER — PROPOFOL 10 MG/ML IV BOLUS
INTRAVENOUS | Status: AC
Start: 2023-09-29 — End: 2023-09-29
  Filled 2023-09-29: qty 20

## 2023-09-29 MED ORDER — CHLORHEXIDINE GLUCONATE 0.12 % MT SOLN
15.0000 mL | Freq: Once | OROMUCOSAL | Status: AC
  Administered 2023-09-29: 15 mL via OROMUCOSAL

## 2023-09-29 MED ORDER — ONDANSETRON HCL 4 MG/2ML IJ SOLN
4.0000 mg | Freq: Once | INTRAMUSCULAR | Status: AC | PRN
  Administered 2023-09-29: 4 mg via INTRAVENOUS

## 2023-09-29 MED ORDER — LACTATED RINGERS IV SOLN: INTRAVENOUS | Status: DC

## 2023-09-29 MED ORDER — FENTANYL CITRATE PF 50 MCG/ML IJ SOSY
25.0000 ug | PREFILLED_SYRINGE | INTRAMUSCULAR | Status: DC | PRN
  Administered 2023-09-29: 50 ug via INTRAVENOUS

## 2023-09-29 MED ORDER — ONDANSETRON HCL 4 MG/2ML IJ SOLN
INTRAMUSCULAR | Status: DC | PRN
Start: 2023-09-29 — End: 2023-09-29
  Administered 2023-09-29: 4 mg via INTRAVENOUS

## 2023-09-29 MED ORDER — LIDOCAINE HCL (PF) 2 % IJ SOLN
INTRAMUSCULAR | Status: DC | PRN
  Administered 2023-09-29: 100 mg via INTRADERMAL

## 2023-09-29 MED ORDER — ROCURONIUM BROMIDE 10 MG/ML (PF) SYRINGE
PREFILLED_SYRINGE | INTRAVENOUS | Status: AC
  Filled 2023-09-29: qty 10

## 2023-09-29 SURGICAL SUPPLY — 30 items
ATTRACTOMAT 16X20 MAGNETIC DRP (DRAPES) ×2 IMPLANT
BAG COUNTER SPONGE SURGICOUNT (BAG) ×2 IMPLANT
BLADE SURG 15 STRL LF DISP TIS (BLADE) ×2 IMPLANT
CHLORAPREP W/TINT 26 (MISCELLANEOUS) ×2 IMPLANT
CLIP TI MEDIUM 6 (CLIP) ×4 IMPLANT
CLIP TI WIDE RED SMALL 6 (CLIP) ×4 IMPLANT
COVER SURGICAL LIGHT HANDLE (MISCELLANEOUS) ×2 IMPLANT
DERMABOND ADVANCED .7 DNX12 (GAUZE/BANDAGES/DRESSINGS) ×2 IMPLANT
DRAPE LAPAROTOMY T 98X78 PEDS (DRAPES) ×2 IMPLANT
DRAPE UTILITY XL STRL (DRAPES) ×2 IMPLANT
ELECT REM PT RETURN 15FT ADLT (MISCELLANEOUS) ×2 IMPLANT
GAUZE 4X4 16PLY ~~LOC~~+RFID DBL (SPONGE) ×2 IMPLANT
GLOVE SURG ORTHO 8.0 STRL STRW (GLOVE) ×2 IMPLANT
GOWN STRL REUS W/ TWL XL LVL3 (GOWN DISPOSABLE) ×6 IMPLANT
HEMOSTAT SURGICEL 2X4 FIBR (HEMOSTASIS) ×2 IMPLANT
ILLUMINATOR WAVEGUIDE N/F (MISCELLANEOUS) IMPLANT
KIT BASIN OR (CUSTOM PROCEDURE TRAY) ×2 IMPLANT
KIT TURNOVER KIT A (KITS) ×2 IMPLANT
NDL HYPO 22X1.5 SAFETY MO (MISCELLANEOUS) ×2 IMPLANT
NEEDLE HYPO 22X1.5 SAFETY MO (MISCELLANEOUS) ×1 IMPLANT
PACK BASIC VI WITH GOWN DISP (CUSTOM PROCEDURE TRAY) ×2 IMPLANT
PENCIL SMOKE EVACUATOR (MISCELLANEOUS) ×2 IMPLANT
SHEARS HARMONIC 9CM CVD (BLADE) IMPLANT
SUT MNCRL AB 4-0 PS2 18 (SUTURE) ×2 IMPLANT
SUT VIC AB 3-0 SH 18 (SUTURE) ×2 IMPLANT
SYR 10ML LL (SYRINGE) IMPLANT
SYR BULB IRRIG 60ML STRL (SYRINGE) ×2 IMPLANT
SYR CONTROL 10ML LL (SYRINGE) ×2 IMPLANT
TOWEL OR 17X26 10 PK STRL BLUE (TOWEL DISPOSABLE) ×2 IMPLANT
TUBING CONNECTING 10 (TUBING) ×2 IMPLANT

## 2023-09-29 NOTE — Interval H&P Note (Signed)
 History and Physical Interval Note:  09/29/2023 10:26 AM  Jon LITTIE Sharps  has presented today for surgery, with the diagnosis of PRIMARY HYPERPARATHYROIDISM.  The various methods of treatment have been discussed with the patient and family. After consideration of risks, benefits and other options for treatment, the patient has consented to    Procedure(s) with comments: PARATHYROIDECTOMY (Right) - RIGHT INFERIOR PARATHYROIDECTOMY as a surgical intervention.    The patient's history has been reviewed, patient examined, no change in status, stable for surgery.  I have reviewed the patient's chart and labs.  Questions were answered to the patient's satisfaction.    Krystal Spinner, MD [facility] practice Office: [phone]   Krystal Spinner

## 2023-09-29 NOTE — Transfer of Care (Signed)
 Immediate Anesthesia Transfer of Care Note  Patient: Kelli Brandt  Procedure(s) Performed: PARATHYROIDECTOMY (Right: Neck)  Patient Location: PACU  Anesthesia Type:General  Level of Consciousness: sedated  Airway & Oxygen Therapy: Patient Spontanous Breathing and Patient connected to face mask oxygen  Post-op Assessment: Report given to RN and Post -op Vital signs reviewed and stable  Post vital signs: Reviewed and stable  Last Vitals:  Vitals Value Taken Time  BP 124/87 09/29/23 12:00  Temp    Pulse 81 09/29/23 12:02  Resp 18 09/29/23 12:02  SpO2 92 % 09/29/23 12:02  Vitals shown include unfiled device data.  Last Pain:  Vitals:   09/29/23 0923  TempSrc: Oral  PainSc: 0-No pain         Complications: No notable events documented.

## 2023-09-29 NOTE — Discharge Instructions (Addendum)

## 2023-09-29 NOTE — Op Note (Signed)
 OPERATIVE REPORT - PARATHYROIDECTOMY  Preoperative diagnosis: Primary hyperparathyroidism  Postop diagnosis: Same  Procedure: Right inferior minimally invasive parathyroidectomy  Surgeon:  Krystal Spinner, MD  Anesthesia: General endotracheal  Estimated blood loss: Minimal  Preparation: ChloraPrep  Indications: Patient is referred by Carmelita Clover, NP, for surgical evaluation and management of newly diagnosed primary hyperparathyroidism. Patient was noted by her primary care physician, Dr. Sari Pay, to have hypercalcemia. This has been present for approximately 1 to 2 years. Recent calcium level was as high as 11.0. Patient underwent further studies including a PTH level which was unsuppressed at 43. A 24-hour urine collection for calcium was markedly elevated at 749. 25-hydroxy vitamin D level was normal at 41.5. Patient has a history of fatigue. She has had bone and joint discomfort. She has had multiple episodes of nephrolithiasis. She has had a bone scan showing bone loss. Patient underwent nuclear medicine parathyroid  scan on Aug 07, 2023. This localized a right inferior parathyroid  adenoma. Subsequent ultrasound was performed on August 19, 2023. This identified a parathyroid  adenoma in the right inferior position measuring 2.3 cm in greatest dimension. Also noted were right sided thyroid  nodules which will require annual ultrasound follow-up in the future. Patient has had no prior head or neck surgery. There is no family history of parathyroid  disease. There is no family history of endocrine neoplasm. Patient presents today to discuss parathyroid  surgery.   Procedure: The patient was prepared in the pre-operative holding area. The patient was brought to the operating room and placed in a supine position on the operating room table. Following administration of general anesthesia, the patient was positioned and then prepped and draped in the usual strict aseptic fashion. After ascertaining that  an adequate level of anesthesia been achieved, a neck incision was made with a #15 blade. Dissection was carried through subcutaneous tissues and platysma. Hemostasis was obtained with the electrocautery. Skin flaps were developed circumferentially and a Weitlander retractor was placed for exposure.  Strap muscles were incised in the midline. Strap muscles were reflected laterally exposing the inferior right thyroid  lobe. With gentle blunt dissection the thyroid  lobe was mobilized.  Dissection was carried posteriorly and an enlarged parathyroid  gland was identified. It was gently mobilized. Vascular structures were divided between small ligaclips. Care was taken to avoid the recurrent laryngeal nerve. The parathyroid  gland was completely excised. It was submitted to pathology where frozen section confirmed hypercellular parathyroid  tissue consistent with adenoma.  Neck was irrigated with warm saline and good hemostasis was noted. Fibrillar was placed in the operative field. Strap muscles were approximated in the midline with interrupted 3-0 Vicryl sutures. Platysma was closed with interrupted 3-0 Vicryl sutures. Marcaine was infiltrated circumferentially. Skin was closed with a running 4-0 Monocryl subcuticular suture. Wound was washed and dried and Dermabond was applied. Patient was awakened from anesthesia and brought to the recovery room. The patient tolerated the procedure well.   Krystal Spinner, MD [facility] Office: [phone]

## 2023-09-29 NOTE — Anesthesia Procedure Notes (Signed)
 Procedure Name: Intubation Date/Time: 09/29/2023 10:56 AM  Performed by: Carleton Garnette SAUNDERS, CRNAPre-anesthesia Checklist: Patient identified, Emergency Drugs available, Suction available, Patient being monitored and Timeout performed Patient Re-evaluated:Patient Re-evaluated prior to induction Oxygen Delivery Method: Circle system utilized Preoxygenation: Pre-oxygenation with 100% oxygen Induction Type: IV induction Ventilation: Mask ventilation without difficulty Laryngoscope Size: Mac and 4 Grade View: Grade I Tube type: Oral Tube size: 7.0 mm Number of attempts: 1 Airway Equipment and Method: Stylet Placement Confirmation: ETT inserted through vocal cords under direct vision, positive ETCO2 and breath sounds checked- equal and bilateral Secured at: 22 cm Tube secured with: Tape Dental Injury: Teeth and Oropharynx as per pre-operative assessment

## 2023-09-29 NOTE — Anesthesia Postprocedure Evaluation (Signed)
 Anesthesia Post Note  Patient: Kelli Brandt  Procedure(s) Performed: PARATHYROIDECTOMY (Right: Neck)     Patient location during evaluation: PACU Anesthesia Type: General Level of consciousness: awake and alert Pain management: pain level controlled Vital Signs Assessment: post-procedure vital signs reviewed and stable Respiratory status: spontaneous breathing, nonlabored ventilation, respiratory function stable and patient connected to nasal cannula oxygen Cardiovascular status: blood pressure returned to baseline and stable Postop Assessment: no apparent nausea or vomiting Anesthetic complications: no   No notable events documented.  Last Vitals:  Vitals:   09/29/23 1315 09/29/23 1339  BP: 122/83 137/74  Pulse: 85 81  Resp: 11 18  Temp:  (!) 36.4 C  SpO2: 99% 100%    Last Pain:  Vitals:   09/29/23 1339  TempSrc:   PainSc: 3                  Laquiesha Piacente

## 2023-09-30 ENCOUNTER — Encounter (HOSPITAL_COMMUNITY): Payer: Self-pay | Admitting: Surgery

## 2023-10-02 LAB — SURGICAL PATHOLOGY
